# Patient Record
Sex: Male | Born: 1970 | Race: White | Hispanic: No | Marital: Married | Smoking: Never smoker
Health system: Southern US, Academic
[De-identification: ages and names within clinical notes are randomized; demographics above are authoritative.]

## PROBLEM LIST (undated history)

## (undated) DIAGNOSIS — K922 Gastrointestinal hemorrhage, unspecified: Secondary | ICD-10-CM

## (undated) DIAGNOSIS — M109 Gout, unspecified: Secondary | ICD-10-CM

## (undated) DIAGNOSIS — I10 Essential (primary) hypertension: Secondary | ICD-10-CM

## (undated) DIAGNOSIS — I639 Cerebral infarction, unspecified: Secondary | ICD-10-CM

## (undated) HISTORY — PX: ESOPHAGOGASTRODUODENOSCOPY: SHX1529

## (undated) HISTORY — PX: HX ANGIOPLASTY: SHX39

---

## 2007-12-19 DIAGNOSIS — D500 Iron deficiency anemia secondary to blood loss (chronic): Secondary | ICD-10-CM | POA: Insufficient documentation

## 2007-12-19 DIAGNOSIS — K922 Gastrointestinal hemorrhage, unspecified: Secondary | ICD-10-CM | POA: Insufficient documentation

## 2014-11-30 HISTORY — PX: COLONOSCOPY: WVUENDOPRO10

## 2016-12-25 ENCOUNTER — Emergency Department (HOSPITAL_COMMUNITY): Payer: Self-pay | Admitting: Emergency Medicine

## 2018-09-30 ENCOUNTER — Emergency Department
Admission: EM | Admit: 2018-09-30 | Discharge: 2018-09-30 | Disposition: A | Discharge status: Home / Self Care | Payer: 59 | Attending: Emergency Medicine | Admitting: Emergency Medicine

## 2018-09-30 ENCOUNTER — Encounter (HOSPITAL_COMMUNITY): Payer: Self-pay

## 2018-09-30 ENCOUNTER — Emergency Department (HOSPITAL_COMMUNITY): Payer: 59

## 2018-09-30 ENCOUNTER — Other Ambulatory Visit: Payer: Self-pay

## 2018-09-30 DIAGNOSIS — I10 Essential (primary) hypertension: Secondary | ICD-10-CM

## 2018-09-30 DIAGNOSIS — M79605 Pain in left leg: Secondary | ICD-10-CM | POA: Insufficient documentation

## 2018-09-30 HISTORY — DX: Gout, unspecified: M10.9

## 2018-09-30 HISTORY — DX: Gastrointestinal hemorrhage, unspecified: K92.2

## 2018-09-30 HISTORY — DX: Essential (primary) hypertension: I10

## 2018-09-30 MED ORDER — KETOROLAC 60 MG/2 ML INTRAMUSCULAR SOLUTION
60.0000 mg | INTRAMUSCULAR | Status: AC
Start: 2018-09-30 — End: 2018-09-30
  Administered 2018-09-30: 60 mg via INTRAMUSCULAR
  Filled 2018-09-30 (×2): qty 2

## 2018-09-30 MED ORDER — IBUPROFEN 800 MG TABLET
800.00 mg | ORAL_TABLET | Freq: Three times a day (TID) | ORAL | 0 refills | Status: DC | PRN
Start: 2018-09-30 — End: 2019-12-12

## 2018-09-30 NOTE — ED Provider Notes (Signed)
[facility]  Emergency Department  Provider Note    Name: Troy Rose  Age and Gender: 47 y.o. male  Date of Birth: [birth year]  Date of Service: 09/30/2018  MRN: [id number]  PCP: No Pcp    Chief Complaint:   Chief Complaint   Patient presents with   . Leg Pain     LLE pain, denies injury       HPI:  Leg Pain   Associated symptoms: no back pain, no fever and no neck pain          This is a 47 y.o. male who presents to the emergency department with complaints of left leg pain.  He had had back pain for about a week ago.  That has slowly improved but there has been improved pain extending down his left leg and all the way down to his left foot since that time.  Those symptoms have all more less resolved with the exception of 1 area in his left calf that is still of concern to him.  It is causing him enough pain that he was unable to sleep last night.  Seems to get worse when he lies flat or sit down.  Better with standing.  Has not taken any medications other than some muscle relaxant medication that he got from his primary care provider yesterday.        Past Medical History:   Past Medical History:   Diagnosis Date   . Acute upper GI bleed    . Gout    . HTN (hypertension)        Past Surgical History:   Past Surgical History:   Procedure Laterality Date   . Esophagogastroduodenoscopy         Social History:   Social History     Tobacco Use   . Smoking status: Never Smoker   . Smokeless tobacco: Never Used   Substance Use Topics   . Alcohol use: Never     Frequency: Never   . Drug use: Never      Social History     Substance and Sexual Activity   Drug Use Never       Allergies:   Allergies   Allergen Reactions   . Tamiflu [Oseltamivir]  Other Adverse Reaction (Add comment)     Jittery         Review of Systems   Constitutional: Negative for chills and fever.   HENT: Negative for congestion, sinus pain and sore throat.    Eyes: Negative for blurred vision and double vision.   Respiratory: Negative  for cough and shortness of breath.    Cardiovascular: Negative for chest pain, palpitations and leg swelling.   Gastrointestinal: Negative for abdominal pain, constipation, diarrhea, heartburn, melena, nausea and vomiting.   Genitourinary: Negative for dysuria, frequency and urgency.   Musculoskeletal: Positive for myalgias. Negative for back pain and neck pain.        Leg pain / calf pain   Skin: Negative for rash.   Neurological: Negative for dizziness, weakness and headaches.   Endo/Heme/Allergies: Negative for polydipsia.   Psychiatric/Behavioral: Negative for depression and suicidal ideas.         BP (!) 182/118   Pulse 78   Temp 36.8 C (98.3 F)   Resp 18   Ht 1.829 m (6')   Wt 115.7 kg (255 lb)   SpO2 98%   BMI 34.58 kg/m         Physical Exam  Constitutional: He is oriented to person, place, and time and well-developed, well-nourished, and in no distress.   HENT:   Head: Normocephalic and atraumatic.   Eyes: Pupils are equal, round, and reactive to light.   Neck: Normal range of motion. Neck supple.   Cardiovascular: Normal rate, regular rhythm, normal heart sounds and intact distal pulses.   Pulmonary/Chest: Effort normal and breath sounds normal. No respiratory distress.   Abdominal: Soft. Bowel sounds are normal. He exhibits no distension.   Musculoskeletal: Normal range of motion. He exhibits no edema.        Left lower leg: He exhibits tenderness. He exhibits no bony tenderness, no swelling, no edema, no deformity and no laceration.        Legs:  Lymphadenopathy:     He has no cervical adenopathy.   Neurological: He is alert and oriented to person, place, and time. Gait normal. GCS score is 15.   Skin: Skin is warm and dry.   Psychiatric: Affect and judgment normal.         Procedures  :    No results found for this or any previous visit (from the past 12 hour(s)).    No orders to display         Clinical Impression:   Encounter Diagnosis   Name Primary?   . Pain in left leg        ED Course /  MDM:   Patient was triaged, vital signs were obtained, patient was  placed in a room.  On exam there is pain on the in the left calf area.  Workup includes a ultrasound does not show any abnormalities as far as DVT.  It appears more focal muscular reproducible pain.  Consideration for referred pain and neuropathic leave from the back although less likely because of his reproducibility.  Will treat with ibuprofen.    ED Course as of Sep 30 1450   Fri Sep 30, 2018   1340 Pt to u/s    [JD]   1340 Rechecking manual blood pressure    [JD]   1447 Pt feeling better overall.  Awaiting u/s results.     [JD]      ED Course User Index  [JD] Alvina Filbert, MD       Medications   ketorolac (TORADOL) 60mg /2 mL IM injection (60 mg Intramuscular Given 09/30/18 1313)       Disposition: Data Unavailable      Alvina Filbert, MD 09/30/2018 14:51       This note was partially created using voice recognition software and is inherently subject to errors including those of syntax and "sound alike " substitutions which may escape proof reading.  In such instances, original meaning may be extrapolated by contextual derivation.

## 2018-09-30 NOTE — Nurses Notes (Signed)
PT D/C TO HOME, D/C INSTRUCTIONS GIVEN AND REVIEWED WITH PT AND PT VERBALIZED UNDERSTANDING, VITALS RECHECKED AND PT REFUSED MANUAL BP, STATED HE IS JUST READY TO LEAVE NOW AND DOESN'T WANT TO WAIT FOR MANUAL BP THAT HE WILL FOLLOW UP WITH PRIMARY DOCTOR REGARDING HIS BP AND LEG PAIN. PT LEFT AMBULATORY WITHOUT INCIDENT REFUSES WHEELCHAIR, PT STATED HE IS DRIVING HIMSELF HOME

## 2018-09-30 NOTE — Incoming ED Transfer Note (Signed)
ED TRANSFER NOTE    Narrative: PT DISCHARGED    Type of Transfer: HOME      **RU[facility] ONLY - Go To Navigator and choose the Type of Activation to print to [facility]**    Reeves Forth, RN, 09/30/2018 13:58

## 2018-09-30 NOTE — ED Triage Notes (Signed)
LLE pain and lower back pain since Sep 10, 2018. Left knee pain. Denies injury or swelling. Evaluated at [facility] yesterday. Had x-ray of back and rx'd a muscle relaxer -- no relief.

## 2018-09-30 NOTE — Discharge Instructions (Addendum)
Ice compression and ibuprofen should help.    Check your blood pressures at home and follow up  with your pcp in the next week to evaluate the need for additional bp meds.

## 2019-12-12 ENCOUNTER — Emergency Department
Admission: EM | Admit: 2019-12-12 | Discharge: 2019-12-12 | Disposition: A | Discharge status: Home / Self Care | Payer: 59 | Attending: PHYSICIAN ASSISTANT | Admitting: PHYSICIAN ASSISTANT

## 2019-12-12 ENCOUNTER — Other Ambulatory Visit: Payer: Self-pay

## 2019-12-12 ENCOUNTER — Emergency Department (HOSPITAL_COMMUNITY): Payer: 59

## 2019-12-12 ENCOUNTER — Encounter (HOSPITAL_COMMUNITY): Payer: Self-pay

## 2019-12-12 DIAGNOSIS — M109 Gout, unspecified: Secondary | ICD-10-CM | POA: Insufficient documentation

## 2019-12-12 LAB — CBC WITH DIFF
BASOPHIL #: <0.1 10*3/uL (ref <=0.20)
BASOPHIL %: 0 %
EOSINOPHIL #: <0.1 10*3/uL (ref <=0.50)
EOSINOPHIL %: 0 %
HCT: 46.3 % (ref 38.9–52.0)
HGB: 14.6 g/dL (ref 13.4–17.5)
IMMATURE GRANULOCYTE #: 0.1 10*3/uL — ABNORMAL HIGH (ref <0.10)
IMMATURE GRANULOCYTE %: 1 % (ref 0–1)
LYMPHOCYTE #: 2.03 10*3/uL (ref 1.00–4.80)
LYMPHOCYTE %: 13 %
MCH: 27.1 pg (ref 26.0–32.0)
MCHC: 31.5 g/dL (ref 31.0–35.5)
MCV: 85.9 fL (ref 78.0–100.0)
MONOCYTE #: 1.34 10*3/uL — ABNORMAL HIGH (ref 0.20–1.10)
MONOCYTE %: 9 %
MPV: 8.6 fL — ABNORMAL LOW (ref 8.7–12.5)
NEUTROPHIL #: 12.04 10*3/uL — ABNORMAL HIGH (ref 1.50–7.70)
NEUTROPHIL %: 77 %
PLATELETS: 519 10*3/uL — ABNORMAL HIGH (ref 150–400)
RBC: 5.39 10*6/uL (ref 4.50–6.10)
RDW-CV: 14.1 % (ref 11.5–15.5)
WBC: 15.6 10*3/uL — ABNORMAL HIGH (ref 3.7–11.0)

## 2019-12-12 LAB — COMPREHENSIVE METABOLIC PANEL, NON-FASTING
ALBUMIN: 4.9 g/dL (ref 3.5–5.0)
ALKALINE PHOSPHATASE: 85 U/L (ref 38–126)
ALT (SGPT): 12 U/L (ref 0–49)
ANION GAP: 16 mmol/L (ref 8–16)
AST (SGOT): 16 U/L — ABNORMAL LOW (ref 17–59)
BILIRUBIN TOTAL: 0.5 mg/dL (ref 0.2–1.3)
BUN/CREA RATIO: 16
BUN: 26 mg/dL — ABNORMAL HIGH (ref 9–20)
CALCIUM: 10.9 mg/dL — ABNORMAL HIGH (ref 8.4–10.2)
CHLORIDE: 102 mmol/L (ref 98–107)
CO2 TOTAL: 21 mmol/L — ABNORMAL LOW (ref 22–30)
CREATININE: 1.6 mg/dL — ABNORMAL HIGH (ref 0.66–1.25)
ESTIMATED GFR: 49 mL/min/{1.73_m2} — ABNORMAL LOW (ref >60)
GLUCOSE: 110 mg/dL — ABNORMAL HIGH (ref 74–106)
POTASSIUM: 4.8 mmol/L (ref 3.5–5.1)
PROTEIN TOTAL: 8.7 g/dL — ABNORMAL HIGH (ref 6.3–8.2)
SODIUM: 139 mmol/L (ref 137–145)

## 2019-12-12 LAB — RED TOP TUBE

## 2019-12-12 LAB — GRAY TOP TUBE

## 2019-12-12 LAB — URIC ACID: URIC ACID: 10.9 mg/dL — ABNORMAL HIGH (ref 3.5–8.5)

## 2019-12-12 LAB — BLUE TOP TUBE

## 2019-12-12 LAB — C-REACTIVE PROTEIN(CRP),INFLAMMATION: C-REACTIVE PROTEIN (CRP): 6.2 mg/dL — ABNORMAL HIGH (ref 0.0–0.9)

## 2019-12-12 LAB — LIGHT GREEN TOP TUBE

## 2019-12-12 MED ORDER — METHYLPREDNISOLONE 4 MG TABLETS IN A DOSE PACK
ORAL_TABLET | ORAL | 0 refills | Status: DC
Start: 2019-12-12 — End: 2022-04-09

## 2019-12-12 MED ORDER — ACETAMINOPHEN 300 MG-CODEINE 30 MG TABLET
1.00 | ORAL_TABLET | Freq: Four times a day (QID) | ORAL | 0 refills | Status: DC | PRN
Start: 2019-12-12 — End: 2022-04-09

## 2019-12-12 MED ORDER — COLCHICINE 0.6 MG TABLET
1.20 mg | ORAL_TABLET | ORAL | Status: AC
Start: 2019-12-12 — End: 2019-12-12
  Administered 2019-12-12: 1.2 mg via ORAL
  Filled 2019-12-12: qty 2

## 2019-12-12 MED ORDER — HYDROCODONE 5 MG-ACETAMINOPHEN 325 MG TABLET
1.0000 | ORAL_TABLET | ORAL | Status: AC
Start: 2019-12-12 — End: 2019-12-12
  Administered 2019-12-12: 14:00:00 1 via ORAL
  Filled 2019-12-12: qty 1

## 2019-12-12 MED ORDER — COLCHICINE 0.6 MG TABLET
0.60 mg | ORAL_TABLET | ORAL | Status: DC
Start: 2019-12-12 — End: 2019-12-12
  Administered 2019-12-12: 0.6 mg via ORAL
  Filled 2019-12-12: qty 1

## 2019-12-12 NOTE — Discharge Instructions (Addendum)
General Instructions:  Today you were seen in the Emergency Department for knee pain and gout. You are considered stable for discharge from the emergency department. Please carefully follow all the instructions you were given verbally as well as the written instructions given below. Immediately return to the emergency department if the symptoms you presented with today increase in severity, change in any way, and/or do not improve in what you consider an acceptable time frame.  Return if you develop fever >101, vomiting, oral liquid intolerance, chest pain, shortness of breath, weakness, change in behavior, or any other concerns.    Medication(s) Instructions:  Discontinue meloxicam due to your kidney function.  Please follow-up with your family doctor on this.  Prescriptions were written for Medrol Dosepak and Tylenol No.  3. Indications, contraindications, adverse effects, potential side effects, and medication instructions were discussed with the patient/patient's family prior to discharge. Please strictly follow the directions as prescribed for taking the medication(s). Should you feel you develop any type of reaction to the medication(s), including, but not limited to, rash, swelling of the lips or face, or difficulty breathing, immediately discontinue the use of the medication(s) and seek prompt medical care. Please read the medication(s) insert provided by the pharmacy and follow all guidelines and recommendations. Please take all medications as prescribed for your symptoms or diagnoses.                Follow-Up Instructions:  Follow-up with your family doctor. It is your responsibility to call and/or make an appointment with the health care providers listed on your discharge papers and/or your primary care provider in the appropriate time frame given.  Please take a copy of your discharge papers with you to your follow-up appointment(s). Please follow up with your primary care physician in 2-3 days or  earlier if needed for your symptoms. Please follow up with any specialist provider in clinic that your were given instructions to see as an outpatient.     YOU MUST CALL THE NUMBER LISTED ON THE DISCHARGE PAPERWORK TO CONFIRM YOUR APPOINTMENT.    Special Information / Instructions:  Please read and follow all attached discharge instructions.      Return Precautions:  Patient/patient's family was advised to return to the ED with any new, concerning or worsening symptoms . The patient/patient's family verbalized understanding of all instructions, were given the opportunity to ask questions, and had no further questions or concerns.     Call the [facility] Emergency Department at [phone] with any questions or concerns.    Return to the Emergency Department if you have persistence or worsening of your symptoms.    Thank you for allowing me to take part in your care.    [employer], PA-C  12/12/2019, 15:11

## 2019-12-12 NOTE — ED Nurses Note (Signed)
Thayer PA-C in to speak to patient

## 2019-12-12 NOTE — ED Triage Notes (Signed)
Left knee pain and swelling denies injury. Hx recent gout flare

## 2019-12-12 NOTE — ED Nurses Note (Signed)
Patient discharged home with family.  AVS reviewed with patient/care giver.  A written copy of the AVS and discharge instructions was given to the patient/care giver.  Questions sufficiently answered as needed.  Patient/care giver encouraged to follow up with PCP as indicated.  In the event of an emergency, patient/care giver instructed to call 911 or go to the nearest emergency room.

## 2019-12-12 NOTE — ED Provider Notes (Signed)
[facility]  Emergency Department  Provider Note      Troy Rose  1971/05/26  48 y.o.  male  [place] [address]   [phone] (home)  [facility]    Chief Complaint:   Chief Complaint   Patient presents with   . Knee Pain     left       HPI: This is a 49 y.o. male who presents to the emergency department with complaints of left knee pain x6 days.  Patient top this was initially his gout flaring up.  He saw a provider at his PCPs office on Friday and was administered Toradol her Solu-Medrol without any benefit.  He has also been taking meloxicam which typically works for him.  He rates his pain at 10/10.  He notes swelling and warmth.  Bearing weight and movement makes the pain worse.  There are no alleviating factors.  He denies injury or fever.    COVID screening:  Patient denies shortness of breath, cough, and lost of taste or smell.  Patient has had no recent travel or known COVID exposures.    Past Medical History:   Past Medical History:   Diagnosis Date   . Acute upper GI bleed    . Gout    . HTN (hypertension)        Past Surgical History:   Past Surgical History:   Procedure Laterality Date   . Esophagogastroduodenoscopy         Social History:   Social History     Tobacco Use   . Smoking status: Never Smoker   . Smokeless tobacco: Never Used   Substance Use Topics   . Alcohol use: Never     Frequency: Never   . Drug use: Never      Social History     Substance and Sexual Activity   Drug Use Never       Allergies:   Allergies   Allergen Reactions   . Motrin [Ibuprofen] GI Bleed   . Tamiflu [Oseltamivir]  Other Adverse Reaction (Add comment)     Jittery       Medications:   Prior to Admission medications    Medication Sig Start Date End Date Taking? Authorizing Provider   lisinopril (PRINIVIL) 10 mg Oral Tablet Take 10 mg by mouth Once a day    Provider, Historical   meloxicam (MOBIC) 7.5 mg Oral Tablet Take 7.5 mg by mouth Once a day   Yes Provider, Historical      metoprolol tartrate (LOPRESSOR) 25 mg Oral Tablet Take 25 mg by mouth Twice daily    Provider, Historical   Ibuprofen (MOTRIN) 800 mg Oral Tablet Take 1 Tab (800 mg total) by mouth Three times a day as needed for Pain (as needed for pain) 09/30/18 12/12/19  Noni Saupe, MD   methocarbamol (ROBAXIN) 500 mg Oral Tablet Take 500 mg by mouth Four times a day  12/12/19  Provider, Historical       Above history, allergies and medication list reviewed with patient.      Review of Systems:  Consitutional:  Negative for fever.  Negative for chills.  Skin:  Negative for rashes or other lesions.  HENT:  Negative for headaches.  Eyes:  Negative for blurry vision or double vision.  No acute vision loss.  Cardiovascular:  Negative for chest pain and palpitations.  Respiratory:  Negative for cough, shortness of breath and wheezing.  Gastrointestinal:  Negative for nausea,  vomiting and abdominal pain.  No diarrhea, constipation, melena or hematochezia.  Musculoskeletal:  Left knee pain Negative for neck pain and back pain.  Neurological:  Negative for dizziness and loss of consciousness.  No numbness, tingling or other sensation changes.  No acute localized weakness.  Psych:  Negative for anxiety or depression.  No suicidal ideations or attempts.  No homicidal ideations or attempts.  Genitourinary:  Negative for dysuria, hematuria and flank pain.  All other systems reviewed and negative.      Physical Exam  Body mass index is 33.36 kg/m.  ED Triage Vitals [12/12/19 1324]   BP (Non-Invasive) (!) 156/105   Heart Rate (!) 120   Respiratory Rate 16   Temperature 37.1 C (98.8 F)   SpO2 98 %   Weight 112 kg (246 lb)   Height 1.829 m (6')     Constitutional: patient is oriented to person, place, and time and well-developed, well-nourished, and in no distress.   HENT:   Mask in place.  Head: Normocephalic and atraumatic.   Right Ear: External ear normal.   Left Ear: External ear normal.   Nose: Nose normal.   Mouth/Throat:  Oropharynx is clear and moist.   Eyes: Pupils are equal, round, and reactive to light. Conjunctivae and EOM are normal.   Neck: Normal range of motion. Neck supple.  Trachea midline.  Cardiovascular: Normal rate, regular rhythm, normal heart sounds and intact distal pulses.   Pulmonary/Chest: Effort normal and breath sounds normal.  No chest wall deformity.  Abdominal: Soft. Bowel sounds are normal.  No rebound tenderness or guarding.  Musculoskeletal:  Left knee is generally tender to palpation.  It is swollen and warm to the touch.  There is no erythema.  He does have full range of motion but it is painful.  Distal pulses are strong and equal.  Neurological: Patient is alert and oriented to person, place, and time. GCS score is 15. Gait is at patient's baseline.  Skin: Skin is warm and dry.   Psychiatric: Mood, memory, affect and judgment normal.        ED Course/ MDM/ Plan:   Patient was triaged, vital signs were obtained, patient was  placed in a room.  On exam, patient is alert and oriented, nontoxic on exam, and in no acute distress. Vitals were reviewed. The following orders were placed after examining the patient and the following results were reviewed by me.    Orders Placed This Encounter   . * XR KNEE LEFT 3 V   . CBC/DIFF   . COMPREHENSIVE METABOLIC PANEL, NON-FASTING   . URIC ACID   . C-REACTIVE PROTEIN(CRP),INFLAMMATION   . CBC WITH DIFF   . EXTRA TUBES   . BLUE TOP TUBE   . RED TOP TUBE   . LIGHT GREEN TOP TUBE   . GRAY TOP TUBE   . HYDROcodone-acetaminophen (NORCO) 5-325 mg per tablet   . colchicine tablet   . colchicine tablet   . Methylprednisolone (MEDROL DOSEPACK) 4 mg Oral Tablets, Dose Pack   . acetaminophen-codeine (TYLENOL #3) 300-30 mg Oral Tablet     * XR KNEE LEFT 3 V    (Results Pending)     Labs Reviewed   COMPREHENSIVE METABOLIC PANEL, NON-FASTING - Abnormal; Notable for the following components:       Result Value    CO2 TOTAL 21 (*)     BUN 26 (*)     CREATININE 1.60 (*)     ESTIMATED  GFR 49 (*)     CALCIUM 10.9 (*)     GLUCOSE 110 (*)     AST (SGOT) 16 (*)     PROTEIN TOTAL 8.7 (*)     All other components within normal limits    Narrative:     Estimated Glomerular Filtration Rate (eGFR) calculated using the CKD-EPI (2009) equation, intended for patients 24 years of age and older. If race and/or gender is not documented or "unknown," there will be no eGFR calculation.   URIC ACID - Abnormal; Notable for the following components:    URIC ACID 10.9 (*)     All other components within normal limits   C-REACTIVE PROTEIN(CRP),INFLAMMATION - Abnormal; Notable for the following components:    C-REACTIVE PROTEIN (CRP) 6.2 (*)     All other components within normal limits   CBC WITH DIFF - Abnormal; Notable for the following components:    WBC 15.6 (*)     PLATELETS 519 (*)     MPV 8.6 (*)     NEUTROPHIL # 12.04 (*)     MONOCYTE # 1.34 (*)     IMMATURE GRANULOCYTE # 0.10 (*)     All other components within normal limits   CBC/DIFF    Narrative:     The following orders were created for panel order CBC/DIFF.  Procedure                               Abnormality         Status                     ---------                               -----------         ------                     CBC WITH DIFF[[id number]]                Abnormal            Final result                 Please view results for these tests on the individual orders.   EXTRA TUBES    Narrative:     The following orders were created for panel order EXTRA TUBES.  Procedure                               Abnormality         Status                     ---------                               -----------         ------                     BLUE TOP QPRF[[id number]]                                    In process  RED TOP XTAV[[id number]]                                     In process                 LIGHT GREEN TOP PVVZ[[id number]]                             In process                 GRAY TOP JQGB[[id number]]                                     In process                   Please view results for these tests on the individual orders.   BLUE TOP TUBE   RED TOP TUBE   LIGHT GREEN TOP TUBE   GRAY TOP TUBE     Results for orders placed or performed during the hospital encounter of 12/12/19 (from the past 12 hour(s))   COMPREHENSIVE METABOLIC PANEL, NON-FASTING   Result Value Ref Range    SODIUM 139 137 - 145 mmol/L    POTASSIUM 4.8 3.5 - 5.1 mmol/L    CHLORIDE 102 98 - 107 mmol/L    CO2 TOTAL 21 (L) 22 - 30 mmol/L    ANION GAP 16 8 - 16 mmol/L    BUN 26 (H) 9 - 20 mg/dL    CREATININE 1.60 (H) 0.66 - 1.25 mg/dL    BUN/CREA RATIO 16     ESTIMATED GFR 49 (L) >60 mL/min/1.92m2    ALBUMIN 4.9 3.5 - 5.0 g/dL    CALCIUM 10.9 (H) 8.4 - 10.2 mg/dL    GLUCOSE 110 (H) 74 - 106 mg/dL    ALKALINE PHOSPHATASE 85 38 - 126 U/L    ALT (SGPT) 12 0 - 49 U/L    AST (SGOT) 16 (L) 17 - 59 U/L    BILIRUBIN TOTAL 0.5 0.2 - 1.3 mg/dL    PROTEIN TOTAL 8.7 (H) 6.3 - 8.2 g/dL   URIC ACID   Result Value Ref Range    URIC ACID 10.9 (H) 3.5 - 8.5 mg/dL   C-REACTIVE PROTEIN(CRP),INFLAMMATION   Result Value Ref Range    C-REACTIVE PROTEIN (CRP) 6.2 (H) 0.0 - 0.9 mg/dL   CBC WITH DIFF   Result Value Ref Range    WBC 15.6 (H) 3.7 - 11.0 x10^3/uL    RBC 5.39 4.50 - 6.10 x10^6/uL    HGB 14.6 13.4 - 17.5 g/dL    HCT 46.3 38.9 - 52.0 %    MCV 85.9 78.0 - 100.0 fL    MCH 27.1 26.0 - 32.0 pg    MCHC 31.5 31.0 - 35.5 g/dL    RDW-CV 14.1 11.5 - 15.5 %    PLATELETS 519 (H) 150 - 400 x10^3/uL    MPV 8.6 (L) 8.7 - 12.5 fL    NEUTROPHIL % 77 %    LYMPHOCYTE % 13 %    MONOCYTE % 9 %    EOSINOPHIL % 0 %    BASOPHIL % 0 %    NEUTROPHIL # 12.04 (H) 1.50 - 7.70 x10^3/uL    LYMPHOCYTE # 2.03 1.00 - 4.80 x10^3/uL  MONOCYTE # 1.34 (H) 0.20 - 1.10 x10^3/uL    EOSINOPHIL # <0.10 <=0.50 x10^3/uL    BASOPHIL # <0.10 <=0.20 x10^3/uL    IMMATURE GRANULOCYTE % 1 0 - 1 %    IMMATURE GRANULOCYTE # 0.10 (H) <0.10 x10^3/uL     No orders to display         The patient received the following medications while in the ED  (also noted above):  Medications   colchicine tablet (has no administration in time range)   colchicine tablet (has no administration in time range)   HYDROcodone-acetaminophen (NORCO) 5-325 mg per tablet (1 Tab Oral Given 12/12/19 1355)       ED Course as of Dec 11 1514   Tue Dec 12, 2019   1433 WBC elevated at 15.6 and platelets 519.   CBC/DIFF(!) [CT]   1503 Elevated 10.9   URIC ACID(!): 10.9 [CT]   1503 Elevated 6.2   C-REACTIVE PROTEIN (CRP)(!): 6.2 [CT]   1503 BUN elevated at 26.  Creatinine elevated 1.6.  Calcium elevated 10.9.  AST is low 16. Total protein is elevated 8.7.   COMPREHENSIVE METABOLIC PANEL, NON-FASTING(!) [CT]      ED Course User Index  [CT] Thayer-Hughes, Jilda Kress, PA-C   Patient's uric acid was significantly elevated.  There were no other processes found such as infectious or bony injury.  Due to the patient's renal function, of recommended the patient discontinue meloxicam.  I recommended him to follow up with his PCP.  He was given colchicine and steroids.  He remained hemodynamically stable during his ED course.  Board of Pharmacy was reviewed.  There are no signs of diversion or abuse noted.  He was discharged home.    Procedures  None    Critical Care:    Not applicable      Clinical Impression:   Encounter Diagnosis   Name Primary?   . Acute gout of left knee, unspecified cause Yes           Disposition: Discharged  New Prescriptions    ACETAMINOPHEN-CODEINE (TYLENOL #3) 300-30 MG ORAL TABLET    Take 1 Tab by mouth Every 6 hours as needed    METHYLPREDNISOLONE (MEDROL DOSEPACK) 4 MG ORAL TABLETS, DOSE PACK    Take as instructed.      [facility] [address]  [place] [address]  [phone]    In 2 days  Have your kidney function rechecked.     BP (!) 156/105   Pulse (!) 120   Temp 37.1 C (98.8 F)   Resp 16   Ht 1.829 m (6')   Wt 112 kg (246 lb)   SpO2 98%   BMI 33.36 kg/m          Brandan Glauber Thayer-Hughes, PA-C       This note was partially created using  voice recognition software and is inherently subject to errors including those of syntax and "sound alike " substitutions which may escape proof reading.  In such instances, original meaning may be extrapolated by contextual derivation.

## 2019-12-12 NOTE — ED Nurses Note (Signed)
Lab at bedside for blood draw.

## 2021-03-13 IMAGING — MR MRI LUMBAR SPINE WITHOUT CONTRAST
6 series · 42 of 48 positions shown · IV contrast (gadolinium)
Comparison: None available.

﻿EXAM:  80788   MRI LUMBAR SPINE WITHOUT CONTRAST
INDICATION: Lower back pain with right lower extremity radiculopathy for several months.
TECHNIQUE: Multiplanar multisequential MRI of the lumbar spine was performed without gadolinium contrast.

[Series 8: T2 · sagittal · 4.0mm · 1.01mm/px · 5 of 13 slices shown (1 of 3)]
[im 1/13]
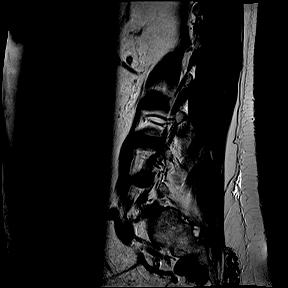
[im 4/13]
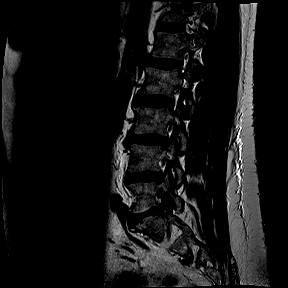
[im 7/13]
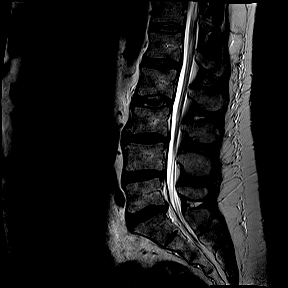
[im 10/13]
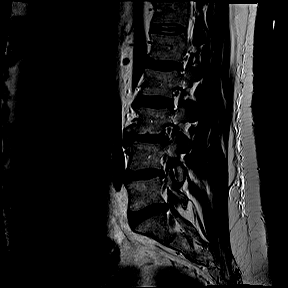
[im 13/13]
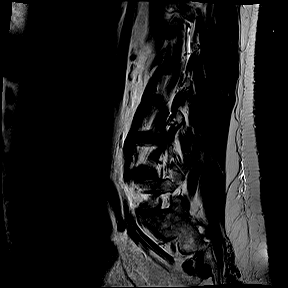

[Series 9: T1 · sagittal · 4.0mm · 1.01mm/px · 6 of 13 slices shown (1 of 2)]
[im 1/13]
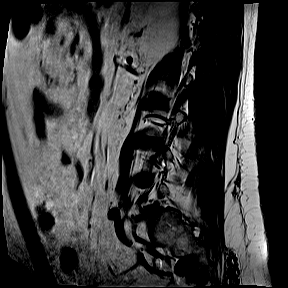
[im 3/13]
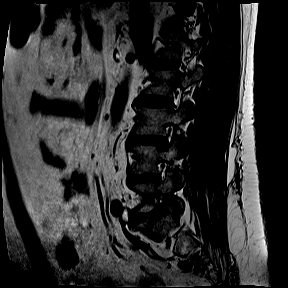
[im 5/13]
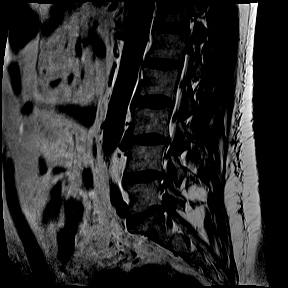
[im 8/13]
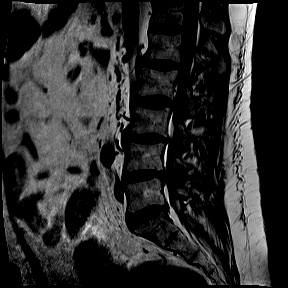
[im 10/13]
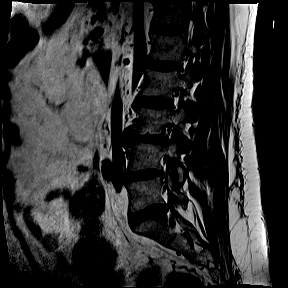
[im 13/13]
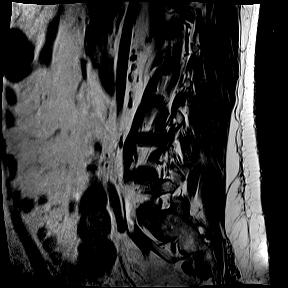

[Series 11: STIR · sagittal · 4.0mm · 1.13mm/px · 3 of 13 slices shown]
[im 1/13]
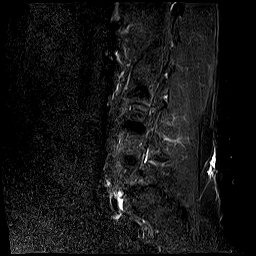
[im 3/13]
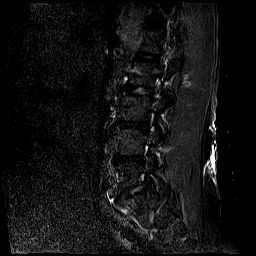
[im 5/13]
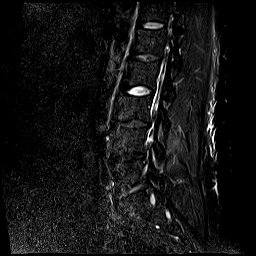

[Series 12: T2 · axial · 4.0mm · 0.47mm/px · z∈[-93,+97]mm · 11 of 23 slices shown (2 of 3)]
[im 1/23]
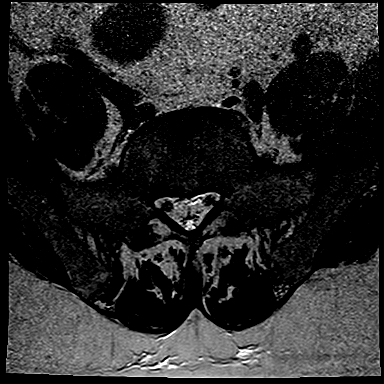
[im 3/23]
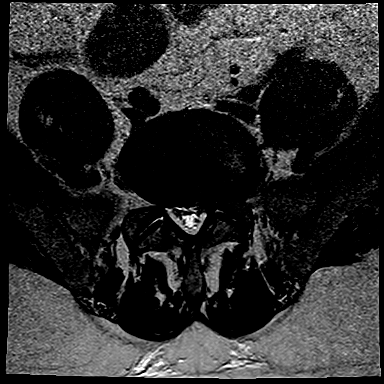
[im 5/23]
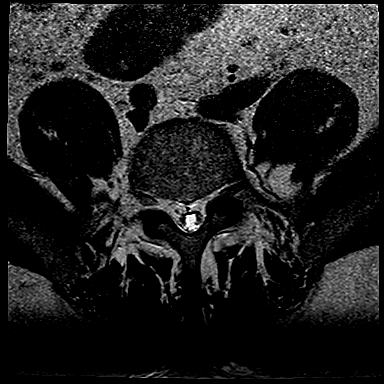
[im 7/23]
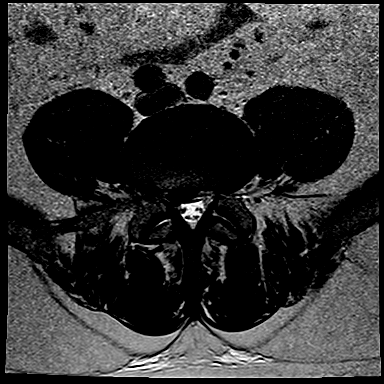
[im 9/23]
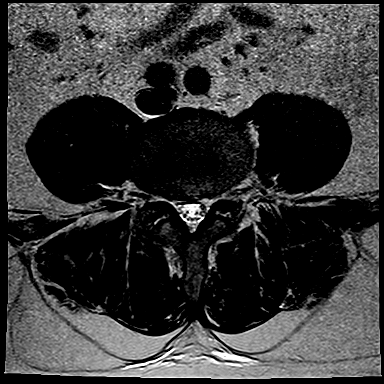
[im 12/23]
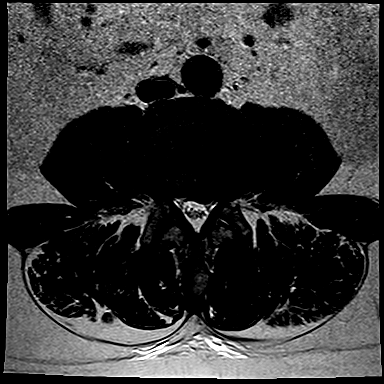
[im 14/23]
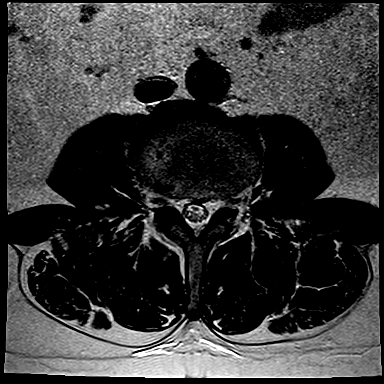
[im 16/23]
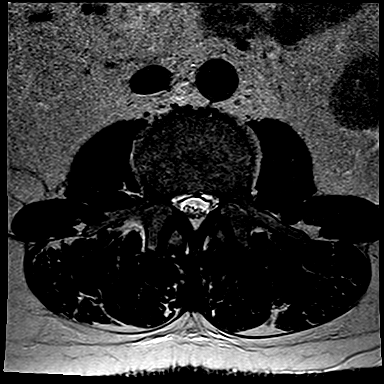
[im 18/23]
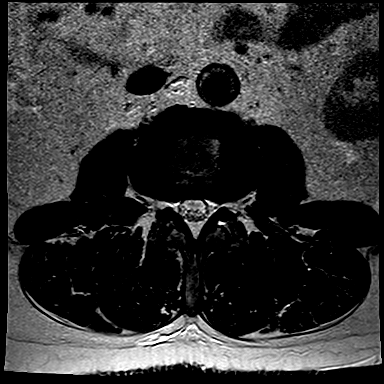
[im 20/23]
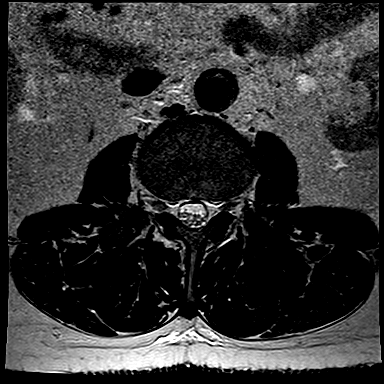
[im 23/23]
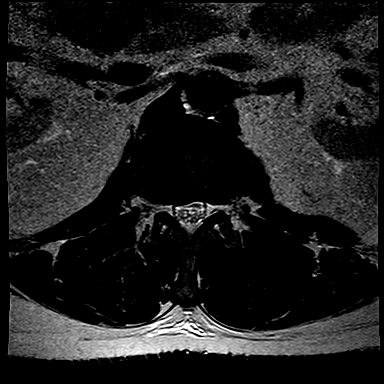

[Series 13: T1 · axial · 4.0mm · 0.47mm/px · z∈[-93,+97]mm · 8 of 23 slices shown (2 of 2)]
[im 1/23]
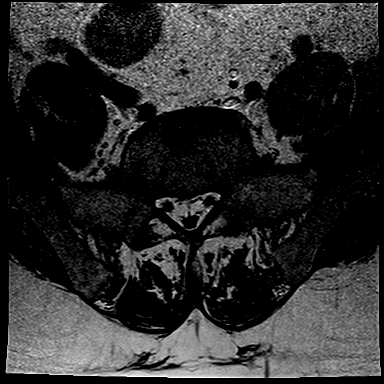
[im 5/23]
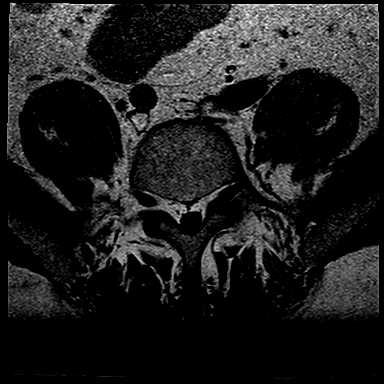
[im 7/23]
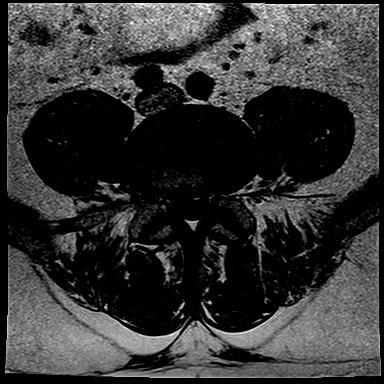
[im 9/23]
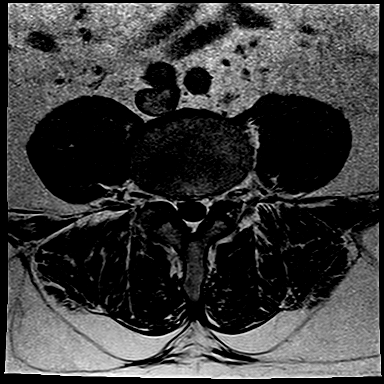
[im 14/23]
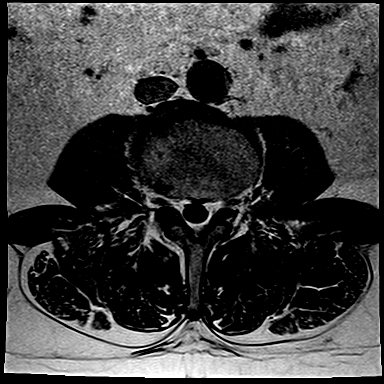
[im 16/23]
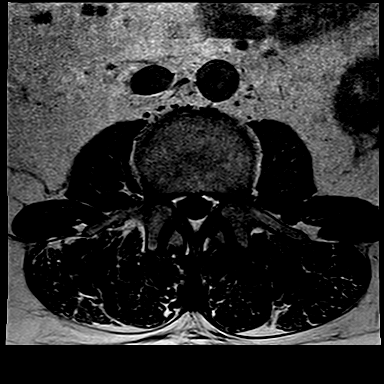
[im 18/23]
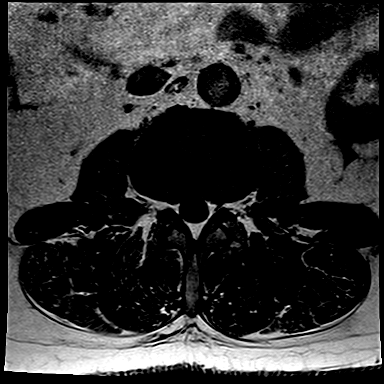
[im 23/23]
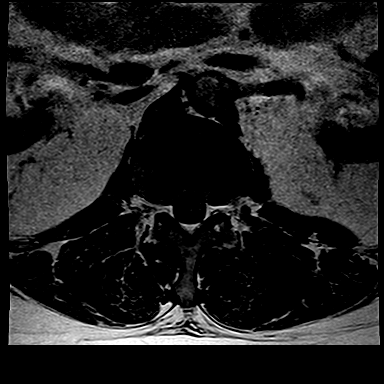

[Series 14: T2 · coronal · 4.0mm · 1.15mm/px · 9 of 20 slices shown (3 of 3)]
[im 1/20]
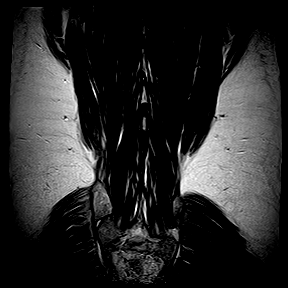
[im 3/20]
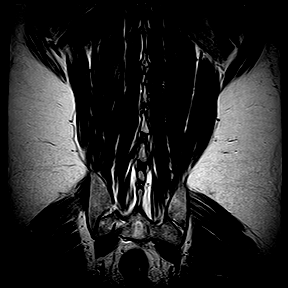
[im 5/20]
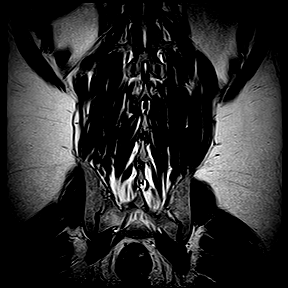
[im 8/20]
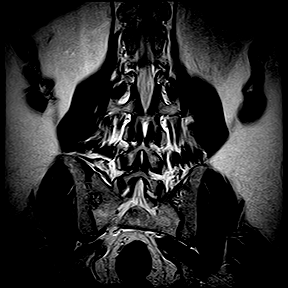
[im 10/20]
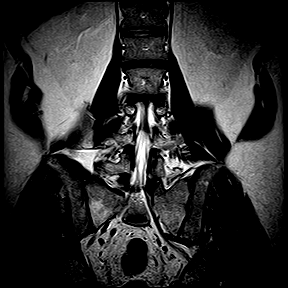
[im 12/20]
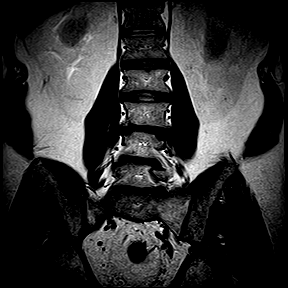
[im 15/20]
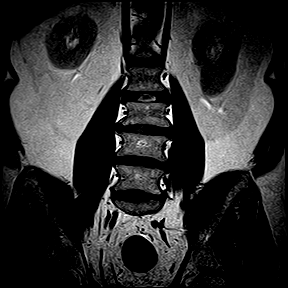
[im 17/20]
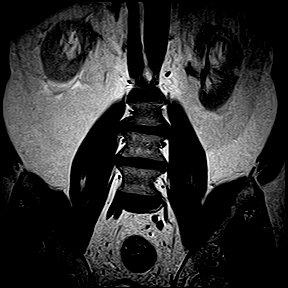
[im 20/20]
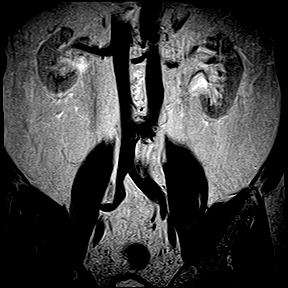

[42 of 48 positions shown; findings below may reference images not displayed]

FINDINGS: Vertebral bodies are normal in height, alignment and signal intensity. There is no acute fracture or subluxation. Distal spinal cord is normal in signal intensity and terminates normally at L1 vertebral body level. Spinal canal is congenitally narrow.

At L1-2 level, there is a small broad-based central and right paracentral disc bulge, mildly effacing the ventral thecal sac. There is mild bilateral neural foraminal stenosis from bulging annulus without nerve root impingement.

L2-3 level is unremarkable.

At L3-4 level, there is a small broad-based central and left paracentral disc bulge mildly effacing the ventral thecal sac. There is moderate right and mild-to-moderate left neural foraminal stenosis from facet arthropathy and bulging annulus.

At L4-5 level, there is a small broad-based central and right paracentral disc bulge resulting in severe right lateral recess compression. There is moderate bilateral neural foraminal stenosis from facet arthropathy and bulging annulus.

At L5-S1 level, there is a small broad-based central disc bulge without mass effect on the thecal sac. There is moderate left and mild right neural foraminal stenosis from facet arthropathy and bulging annulus.

Paraspinal soft tissues are unremarkable.
IMPRESSION: 1. Severe right lateral recess compression at L4-5 level from a small central and right paracentral disc bulge. 

2. Multilevel neural foraminal stenosis as detailed above.

## 2022-04-05 ENCOUNTER — Inpatient Hospital Stay
Admission: EM | Admit: 2022-04-05 | Discharge: 2022-04-09 | DRG: 549 | Disposition: A | Discharge status: Home / Self Care | Payer: 59 | Attending: Hospitalist | Admitting: Hospitalist

## 2022-04-05 ENCOUNTER — Other Ambulatory Visit: Payer: Self-pay

## 2022-04-05 ENCOUNTER — Emergency Department (HOSPITAL_COMMUNITY): Payer: 59

## 2022-04-05 ENCOUNTER — Encounter (HOSPITAL_COMMUNITY): Payer: Self-pay | Admitting: Emergency Medicine

## 2022-04-05 ENCOUNTER — Inpatient Hospital Stay (HOSPITAL_COMMUNITY): Payer: 59 | Admitting: Internal Medicine

## 2022-04-05 DIAGNOSIS — G8194 Hemiplegia, unspecified affecting left nondominant side: Secondary | ICD-10-CM

## 2022-04-05 DIAGNOSIS — R509 Fever, unspecified: Secondary | ICD-10-CM

## 2022-04-05 DIAGNOSIS — I69354 Hemiplegia and hemiparesis following cerebral infarction affecting left non-dominant side: Secondary | ICD-10-CM

## 2022-04-05 DIAGNOSIS — M009 Pyogenic arthritis, unspecified: Principal | ICD-10-CM | POA: Diagnosis present

## 2022-04-05 DIAGNOSIS — M79609 Pain in unspecified limb: Secondary | ICD-10-CM

## 2022-04-05 DIAGNOSIS — Z87891 Personal history of nicotine dependence: Secondary | ICD-10-CM

## 2022-04-05 DIAGNOSIS — I10 Essential (primary) hypertension: Secondary | ICD-10-CM | POA: Diagnosis present

## 2022-04-05 DIAGNOSIS — Z8673 Personal history of transient ischemic attack (TIA), and cerebral infarction without residual deficits: Secondary | ICD-10-CM

## 2022-04-05 DIAGNOSIS — M109 Gout, unspecified: Secondary | ICD-10-CM | POA: Diagnosis present

## 2022-04-05 DIAGNOSIS — M25462 Effusion, left knee: Principal | ICD-10-CM

## 2022-04-05 HISTORY — DX: Cerebral infarction, unspecified: I63.9

## 2022-04-05 LAB — CBC WITH DIFF
BASOPHIL #: 0 10*3/uL (ref 0.00–0.30)
BASOPHIL %: 0 % (ref 0–3)
EOSINOPHIL #: 0.1 10*3/uL (ref 0.00–0.80)
EOSINOPHIL %: 1 % (ref 0–7)
HCT: 35.9 % — ABNORMAL LOW (ref 42.0–51.0)
HGB: 12.2 g/dL — ABNORMAL LOW (ref 13.5–18.0)
LYMPHOCYTE #: 1.6 10*3/uL (ref 1.10–5.00)
LYMPHOCYTE %: 14 % — ABNORMAL LOW (ref 25–45)
MCH: 29.1 pg (ref 27.0–32.0)
MCHC: 33.9 g/dL (ref 32.0–36.0)
MCV: 85.8 fL (ref 78.0–99.0)
MONOCYTE #: 1 10*3/uL (ref 0.00–1.30)
MONOCYTE %: 8 % (ref 0–12)
MPV: 6.9 fL — ABNORMAL LOW (ref 7.4–10.4)
NEUTROPHIL #: 9.2 10*3/uL — ABNORMAL HIGH (ref 1.80–8.40)
NEUTROPHIL %: 78 % — ABNORMAL HIGH (ref 40–76)
PLATELETS: 346 10*3/uL (ref 140–440)
RBC: 4.18 10*6/uL — ABNORMAL LOW (ref 4.20–6.00)
RDW: 14.5 % (ref 11.6–14.8)
WBC: 11.9 10*3/uL — ABNORMAL HIGH (ref 4.0–10.5)
WBCS UNCORRECTED: 11.9 10*3/uL

## 2022-04-05 LAB — COMPREHENSIVE METABOLIC PANEL, NON-FASTING
ALBUMIN/GLOBULIN RATIO: 1 (ref 0.8–1.4)
ALBUMIN: 3.7 g/dL (ref 3.5–5.7)
ALKALINE PHOSPHATASE: 51 U/L (ref 34–104)
ALT (SGPT): 16 U/L (ref 7–52)
ANION GAP: 10 mmol/L (ref 10–20)
AST (SGOT): 11 U/L — ABNORMAL LOW (ref 13–39)
BILIRUBIN TOTAL: 0.5 mg/dL (ref 0.3–1.2)
BUN/CREA RATIO: 14 (ref 6–22)
BUN: 19 mg/dL (ref 7–25)
CALCIUM, CORRECTED: 9.9 mg/dL (ref 8.9–10.8)
CALCIUM: 9.6 mg/dL (ref 8.6–10.3)
CHLORIDE: 100 mmol/L (ref 98–107)
CO2 TOTAL: 24 mmol/L (ref 21–31)
CREATININE: 1.4 mg/dL — ABNORMAL HIGH (ref 0.60–1.30)
ESTIMATED GFR: 60 mL/min/{1.73_m2} (ref >59)
GLOBULIN: 3.8 (ref 2.9–5.4)
GLUCOSE: 129 mg/dL — ABNORMAL HIGH (ref 74–109)
OSMOLALITY, CALCULATED: 272 mOsm/kg (ref 270–290)
POTASSIUM: 3.8 mmol/L (ref 3.5–5.1)
PROTEIN TOTAL: 7.5 g/dL (ref 6.4–8.9)
SODIUM: 134 mmol/L — ABNORMAL LOW (ref 136–145)

## 2022-04-05 LAB — SYNOVIAL FLUID DIFFERENTIAL
NEUTROPHIL %: 92 %
OTHER CELL %: 8 %

## 2022-04-05 LAB — SYNOVIAL FLUID: NUCLEATED CELLS, FLUID: 14488 /uL

## 2022-04-05 LAB — LACTIC ACID LEVEL W/ REFLEX FOR LEVEL >2.0: LACTIC ACID: 1.1 mmol/L (ref 0.5–2.2)

## 2022-04-05 LAB — URIC ACID: URIC ACID: 5 mg/dL (ref 2.3–7.6)

## 2022-04-05 MED ORDER — SODIUM CHLORIDE 0.9 % IV BOLUS
500.0000 mL | INJECTION | Status: AC
Start: 2022-04-05 — End: 2022-04-06
  Administered 2022-04-05: 500 mL via INTRAVENOUS
  Administered 2022-04-06: 0 mL via INTRAVENOUS

## 2022-04-05 MED ORDER — SODIUM CHLORIDE 0.9 % INTRAVENOUS SOLUTION
INTRAVENOUS | Status: DC
Start: 2022-04-06 — End: 2022-04-06
  Administered 2022-04-06: 0 mL via INTRAVENOUS

## 2022-04-05 MED ORDER — SODIUM CHLORIDE 0.9 % (FLUSH) INJECTION SYRINGE
3.0000 mL | INJECTION | INTRAMUSCULAR | Status: DC | PRN
Start: 2022-04-05 — End: 2022-04-09

## 2022-04-05 MED ORDER — SODIUM CHLORIDE 0.9 % INTRAVENOUS PIGGYBACK
2.0000 g | INTRAVENOUS | Status: AC
Start: 2022-04-06 — End: 2022-04-06
  Administered 2022-04-06: 0 g via INTRAVENOUS
  Administered 2022-04-06: 2 g via INTRAVENOUS

## 2022-04-05 MED ORDER — LIDOCAINE HCL 20 MG/ML (2 %) INJECTION SOLUTION
15.00 mL | INTRAMUSCULAR | Status: AC
Start: 2022-04-05 — End: 2022-04-05
  Administered 2022-04-05: 300 mg via INTRADERMAL

## 2022-04-05 MED ORDER — SODIUM CHLORIDE 0.9 % (FLUSH) INJECTION SYRINGE
3.0000 mL | INJECTION | Freq: Three times a day (TID) | INTRAMUSCULAR | Status: DC
Start: 2022-04-05 — End: 2022-04-09
  Administered 2022-04-05 – 2022-04-06 (×2): 3 mL
  Administered 2022-04-06: 0 mL
  Administered 2022-04-06: 3 mL
  Administered 2022-04-07 (×2): 0 mL
  Administered 2022-04-07 – 2022-04-08 (×2): 3 mL
  Administered 2022-04-08: 0 mL
  Administered 2022-04-08: 3 mL
  Administered 2022-04-09: 0 mL

## 2022-04-05 MED ORDER — LIDOCAINE HCL 20 MG/ML (2 %) INJECTION SOLUTION
INTRAMUSCULAR | Status: AC
Start: 2022-04-05 — End: 2022-04-05
  Filled 2022-04-05: qty 20

## 2022-04-05 NOTE — ED Provider Notes (Signed)
[facility]  ED Primary Provider Note  Patient Name: Troy Rose  Patient Age: 51 y.o.  Date of Birth: July 02, 1971    Chief Complaint: Leg Pain (Left leg behind knee)        History of Present Illness       Troy Rose is a 51 y.o. male who had concerns including Leg Pain.     HPI     Chief Complaint: Knee pain    HPI:   Symptoms: Patient has left knee pain.  He states that he has been walking on it since getting out of the hospital to try to exercise and it is very painful now.  He states that swollen but not hot and red.  He states it hurts worse behind the left knee.  Location: Left popliteal area  Radiation: Left calf  Severity: Moderate to severe  Quality: Tightness and swelling  Duration: Const  Onset: Acute  Timing: 2 days  Context: Patient is currently not on any anticoagulation  Mechanism: The patient just got out of the hospital.  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  [facility] as [facility].  Additional Symptoms: Patient's blood pressure has been running high lately.  The family states they have been getting conflicting instructions.  The wife states that the vascular surgeon wants the blood pressure around 789 systolic.  Neurology did not give a guideline but states they want it higher than that.  Patient does have some difficulty talking at baseline after his stroke.  He has no difficulty walking.  He states that he is actually rehabilitating very well.  He denies any chest pain.  He denies any palpitations or fast heart rate.  Patient denies any syncope or presyncopal episodes.  Patient denies any shortness of breath or hemoptysis.    Social History  Smoking: Patient quit smoking in the past  Alcohol: None  Ilicit Drugs: None  Patient is married and has good family support    Past Medical History:Reviewed and listed in the chart below.    Past Surgical History: Reviewed and listed on the chart  below.    Allergies: Reviewed and listed on the chart    Medications: Reviewed and listed on the chart.    The nursing notes were reviewed and agreed upon unless otherwise stated    The vital signs were reviewed and are listed on the chart      Review of Systems       ROS: No other overt Review of Systems are noted to be positive except noted in the HPI.        Physical Exam   ED Triage Vitals [04/05/22 1925]   BP (Non-Invasive) (!) 187/135   Heart Rate (!) 117   Respiratory Rate 18   Temperature (!) 38.2 C (100.7 F)   SpO2 96 %   Weight 109 kg (240 lb)   Height 1.829 m (6')         Physical Exam   Physical exam  Constitutional/general:     Pleasant 51 year old male who is in no acute distress in no acute distress.  He appears to be uncomfortable but no severe pain.  HEENT: Eyes show normal extraocular movements.  Well-hydrated oral mucosa is noted.  There is no facial trauma or abnormalities.  Neck: No midline tenderness, no meningeal signs.  Full range of motion.  No JVD.  respiratory: No respiratory distress.  Patient speaks in full complete sentences  GI: Abdomen is soft non tender normal bowel sounds are auscultated.  There is no rebound tenderness or guarding.  Mildly obese abdomen BMI of 33.  Neuro cranial nerves II through XII are intact and normal.  Patient has mild dysarthria and mild difficulty expressing what he wants to say..  There is no muscle weakness in any extremities.  Sensation is intact throughout.  Psych: Patient is alert and oriented person place and time.  Patient is very pleasant converse with has a euthymic affect.  There are no signs of depression or anxiety.  Skin: No rash.  No petechiae or purpura.  The skin is warm and dry without diaphoresis.  There is no pallor.  Musculoskeletal: Patient has a significant effusion to the left knee.  There is tenderness to palpation to the medial tibial plateau as well as the medial aspect of the distal femur.  He has significant tenderness palpation  to the left popliteal area.  There is only minimal tenderness palpation to the left calf.  There is no other tenderness palpation in any other body region.    GU: Deferred       Arthrocentesis    Date/Time: 04/05/2022 9:57 PM  Performed by: Darryll Capers, DO  Authorized by: Darryll Capers, DO     Consent:     Consent obtained:  Verbal    Consent given by:  Patient    Risks, benefits, and alternatives were discussed: yes      Risks discussed:  Bleeding, infection and pain    Alternatives discussed:  No treatment and delayed treatment  Universal protocol:     Patient identity confirmed:  Verbally with patient  Location:     Location:  Knee    Knee:  L knee  Anesthesia:     Anesthesia method:  Local infiltration    Local anesthetic:  Lidocaine 2% w/o epi  Procedure details:     Preparation: Patient was prepped and draped in usual sterile fashion      Needle gauge:  18 G    Ultrasound guidance: no      Approach:  Lateral    Aspirate amount:  25 cc    Aspirate characteristics:  Yellow and cloudy    Steroid injected: no      Specimen collected: yes    Post-procedure details:     Dressing:  Adhesive bandage    Procedure completion:  Tolerated well, no immediate complications      Sample sent to lab for --Crystal id, C&S, Cell count and diff    Patient Data     Labs Ordered/Reviewed   COMPREHENSIVE METABOLIC PANEL, NON-FASTING - Abnormal; Notable for the following components:       Result Value    SODIUM 134 (*)     CREATININE 1.40 (*)     GLUCOSE 129 (*)     AST (SGOT) 11 (*)     All other components within normal limits    Narrative:     Estimated Glomerular Filtration Rate (eGFR) is calculated using the CKD-EPI (2021) equation, intended for patients 65 years of age and older. If gender is not documented or "unknown", there will be no eGFR calculation.   CBC WITH DIFF - Abnormal; Notable for the following components:    WBC 11.9 (*)     RBC 4.18 (*)     HGB 12.2 (*)     HCT 35.9 (*)     MPV 6.9 (*)  NEUTROPHIL % 78 (*)      LYMPHOCYTE % 14 (*)     NEUTROPHIL # 9.20 (*)     All other components within normal limits   URIC ACID - Normal   LACTIC ACID LEVEL W/ REFLEX FOR LEVEL >2.0 - Normal   STERILE SITE CULTURE AND GRAM STAIN, AEROBIC   ADULT ROUTINE BLOOD CULTURE, SET OF 2 BOTTLES (BACTERIA AND YEAST)   ADULT ROUTINE BLOOD CULTURE, SET OF 2 BOTTLES (BACTERIA AND YEAST)   CELL CNT JOINT/SYNOVIAL FLUID    Narrative:     The following orders were created for panel order CELL CNT JOINT/SYNOVIAL FLUID.  Procedure                               Abnormality         Status                     ---------                               -----------         ------                     SYNOVIAL FYBOF[[id number]]                                   Final result               SYNOVIAL FLUID DIFFERENTIAL[[id number]]                      Final result                 Please view results for these tests on the individual orders.   SYNOVIAL FLUID   SYNOVIAL FLUID DIFFERENTIAL   CBC/DIFF    Narrative:     The following orders were created for panel order CBC/DIFF.  Procedure                               Abnormality         Status                     ---------                               -----------         ------                     CBC WITH DPOE[[id number]]                Abnormal            Final result                 Please view results for these tests on the individual orders.   CRYSTALS    Narrative:     The following orders were created for panel order CRYSTALS.  Procedure                               Abnormality  Status                     ---------                               -----------         ------                     SYNOVIAL (JOINT) FLUID C.Marland KitchenMarland Kitchen[[id number]]                      In process                   Please view results for these tests on the individual orders.   SYNOVIAL (JOINT) FLUID CRYSTALS   EXTRA TUBES    Narrative:     The following orders were created for panel order EXTRA TUBES.  Procedure                                Abnormality         Status                     ---------                               -----------         ------                     BLUE TOP IOXB[[id number]]                                    In process                   Please view results for these tests on the individual orders.   BLUE TOP TUBE     PATIENT NAME: Romano, Stigger MED REC NO: [id number]   BIRTH DATE: 01/22/1971 ORDER: [id number]   SEX: M ORDER FROM: [facility]   REQUESTING PHYS: Darryll Capers SERVICE DATE: 04/05/2022  8:30 PM    ATTENDING Oris DroneDarryll Capers REPORT DATE: 04/05/2022  8:34 PM   REASON: Popliteal pain     Final  VENOUS DUPLEX LOWER LEFT 19417                      Study Result    Narrative & Impression     .  No DVT         Scans on Order [id number]                    This study was interpreted by: Ileene Hutchinson, MD   This report has been reviewed and released by: Signed by: Ileene Hutchinson, MD on 04/05/2022  8:34 PM           No orders to display       Medical Decision Making          Medical Decision Making  Effusion of left knee joint: acute illness or injury  H/O: CVA (cerebrovascular accident): chronic illness or injury  Hypertension, unspecified type: chronic illness or injury  Left hemiparesis (CMS [facility]): chronic illness or injury  Popliteal pain: acute  illness or injury  Amount and/or Complexity of Data Reviewed  Labs: ordered. Decision-making details documented in ED Course.  Radiology: ordered. Decision-making details documented in ED Course.          Decision made complexity: The patient's white blood cell count in the knee is 14,488.  In and of itself, this does not constitute an infected knee.  However, the patient also has a temperature of 100.8 with no other source.  Furthermore, I cannot get crystal interpretation tonight.  I spoke with orthopedic surgery on-call-Greg Southers.  He suggest keeping the patient overnight on medicine service and awaiting the crystal count tomorrow as well as the Gram stain.  He states that  orthopedic surgery be happy to consult.  At 2300 I have ordered blood cultures, WBC and CMP.  Also ordered a lactic acid and I am giving him antibiotics by IV.  At 2340: I did another evaluation in the chart and I realized the patient is actually mildly tachycardic.  I overlooked this earlier but I do feel that given the elevated white cell count in the joint space, fever of 100.8 and tachycardia he does need to be evaluated.  Additionally I am starting him on cefazolin 2 g IV.        ED Course as of 04/06/22 0002   Sun Apr 05, 2022   2238 NUCLEATED CELLS: 14,488         Medications Administered in the ED   NS flush syringe (3 mL Intracatheter Given 04/05/22 2300)   NS flush syringe (has no administration in time range)   NS bolus infusion 500 mL (has no administration in time range)   lidocaine 2% injection (300 mg Intradermal Given 04/05/22 2150)                    Clinical Impression   Popliteal pain   Effusion of left knee joint (Primary)   H/O: CVA (cerebrovascular accident)   Left hemiparesis (CMS HCC)   Hypertension, unspecified type   Fever   Septic arthritis (CMS [facility])           Admitted      Current Discharge Medication List                _______________________________  Darryll Capers, D.O.  Emergency Medicine  [place]

## 2022-04-05 NOTE — ED Triage Notes (Signed)
[facility] called for left leg pain behind the left knee. Pt informed ems "that he has a hx of DVT's. With hx of strokes as well." BS 116

## 2022-04-06 ENCOUNTER — Encounter (HOSPITAL_COMMUNITY): Payer: Self-pay | Admitting: Internal Medicine

## 2022-04-06 ENCOUNTER — Inpatient Hospital Stay (HOSPITAL_COMMUNITY): Payer: 59

## 2022-04-06 DIAGNOSIS — M79672 Pain in left foot: Secondary | ICD-10-CM

## 2022-04-06 DIAGNOSIS — M009 Pyogenic arthritis, unspecified: Principal | ICD-10-CM

## 2022-04-06 DIAGNOSIS — M109 Gout, unspecified: Secondary | ICD-10-CM

## 2022-04-06 DIAGNOSIS — Z8673 Personal history of transient ischemic attack (TIA), and cerebral infarction without residual deficits: Secondary | ICD-10-CM

## 2022-04-06 LAB — BLUE TOP TUBE

## 2022-04-06 LAB — URINALYSIS, MACROSCOPIC
BILIRUBIN: NEGATIVE mg/dL
BLOOD: NEGATIVE mg/dL
GLUCOSE: NEGATIVE mg/dL
KETONES: NEGATIVE mg/dL
LEUKOCYTES: NEGATIVE WBCs/uL
NITRITE: NEGATIVE
PH: 6.5 (ref 5.0–9.0)
PROTEIN: 30 mg/dL — AB
SPECIFIC GRAVITY: 1.013 (ref 1.002–1.030)
UROBILINOGEN: NORMAL mg/dL

## 2022-04-06 LAB — URINALYSIS, MICROSCOPIC
BACTERIA: NEGATIVE /hpf
RBCS: 2 /hpf (ref <4)
WBCS: <1 /hpf (ref <6)

## 2022-04-06 LAB — SYNOVIAL (JOINT) FLUID CRYSTALS

## 2022-04-06 MED ORDER — SODIUM CHLORIDE 0.9 % INTRAVENOUS PIGGYBACK
INJECTION | INTRAVENOUS | Status: AC
Start: 2022-04-06 — End: 2022-04-06
  Filled 2022-04-06: qty 100

## 2022-04-06 MED ORDER — VANCOMYCIN IV - PHARMACIST TO DOSE PER PROTOCOL
Freq: Every day | Status: DC | PRN
Start: 2022-04-06 — End: 2022-04-09

## 2022-04-06 MED ORDER — CEFAZOLIN 1 GRAM SOLUTION FOR INJECTION
INTRAMUSCULAR | Status: AC
Start: 2022-04-06 — End: 2022-04-06
  Filled 2022-04-06: qty 20

## 2022-04-06 MED ORDER — ACETAMINOPHEN 325 MG TABLET
650.0000 mg | ORAL_TABLET | Freq: Four times a day (QID) | ORAL | Status: DC | PRN
Start: 2022-04-06 — End: 2022-04-09

## 2022-04-06 MED ORDER — CEFTRIAXONE 2 GRAM SOLUTION FOR INJECTION
INTRAMUSCULAR | Status: AC
Start: 2022-04-06 — End: 2022-04-06
  Filled 2022-04-06: qty 20

## 2022-04-06 MED ORDER — ALLOPURINOL 100 MG TABLET
100.0000 mg | ORAL_TABLET | Freq: Every day | ORAL | Status: DC
Start: 2022-04-06 — End: 2022-04-09
  Administered 2022-04-06 – 2022-04-09 (×4): 100 mg via ORAL
  Filled 2022-04-06 (×4): qty 1

## 2022-04-06 MED ORDER — HYDROCODONE 5 MG-ACETAMINOPHEN 325 MG TABLET
1.0000 | ORAL_TABLET | Freq: Four times a day (QID) | ORAL | Status: DC | PRN
Start: 2022-04-06 — End: 2022-04-09
  Administered 2022-04-06 – 2022-04-09 (×5): 1 via ORAL
  Filled 2022-04-06 (×5): qty 1

## 2022-04-06 MED ORDER — METOPROLOL TARTRATE 25 MG TABLET
25.0000 mg | ORAL_TABLET | Freq: Two times a day (BID) | ORAL | Status: DC
Start: 2022-04-06 — End: 2022-04-09
  Administered 2022-04-06 – 2022-04-07 (×5): 25 mg via ORAL
  Administered 2022-04-08: 0 mg via ORAL
  Administered 2022-04-08 – 2022-04-09 (×2): 25 mg via ORAL
  Filled 2022-04-06 (×8): qty 1

## 2022-04-06 MED ORDER — SODIUM CHLORIDE 0.9 % INTRAVENOUS PIGGYBACK
2.0000 g | INTRAVENOUS | Status: DC
Start: 2022-04-06 — End: 2022-04-09
  Administered 2022-04-06: 2 g via INTRAVENOUS
  Administered 2022-04-06: 0 g via INTRAVENOUS
  Administered 2022-04-07: 2 g via INTRAVENOUS
  Administered 2022-04-07 – 2022-04-08 (×2): 0 g via INTRAVENOUS
  Administered 2022-04-08 – 2022-04-09 (×2): 2 g via INTRAVENOUS
  Administered 2022-04-09: 0 g via INTRAVENOUS
  Filled 2022-04-06 (×3): qty 20

## 2022-04-06 MED ORDER — SODIUM CHLORIDE 0.9 % INTRAVENOUS SOLUTION
INTRAVENOUS | Status: DC
Start: 2022-04-06 — End: 2022-04-07
  Administered 2022-04-06 – 2022-04-07 (×2): 0 mL via INTRAVENOUS

## 2022-04-06 MED ORDER — VANCOMYCIN 10 GRAM INTRAVENOUS SOLUTION
15.0000 mg/kg | Freq: Two times a day (BID) | INTRAVENOUS | Status: DC
Start: 2022-04-06 — End: 2022-04-09
  Administered 2022-04-06 (×2): 1250 mg via INTRAVENOUS
  Administered 2022-04-06 (×2): 0 mg via INTRAVENOUS
  Administered 2022-04-07 (×2): 1250 mg via INTRAVENOUS
  Administered 2022-04-07 (×2): 0 mg via INTRAVENOUS
  Administered 2022-04-08: 1250 mg via INTRAVENOUS
  Administered 2022-04-08 (×2): 0 mg via INTRAVENOUS
  Administered 2022-04-08 – 2022-04-09 (×2): 1250 mg via INTRAVENOUS
  Administered 2022-04-09: 0 mg via INTRAVENOUS
  Filled 2022-04-06 (×10): qty 12.5

## 2022-04-06 MED ORDER — HEPARIN (PORCINE) 5,000 UNIT/ML INJECTION SOLUTION
5000.0000 [IU] | Freq: Three times a day (TID) | INTRAMUSCULAR | Status: DC
Start: 2022-04-06 — End: 2022-04-06
  Administered 2022-04-06 (×2): 5000 [IU] via SUBCUTANEOUS
  Filled 2022-04-06 (×2): qty 1

## 2022-04-06 MED ORDER — ENALAPRILAT 1.25 MG/ML INTRAVENOUS SOLUTION
0.6250 mg | INTRAVENOUS | Status: AC
Start: 2022-04-06 — End: 2022-04-06
  Administered 2022-04-06: 0.625 mg via INTRAVENOUS
  Filled 2022-04-06: qty 2

## 2022-04-06 MED ORDER — LISINOPRIL 5 MG TABLET
10.0000 mg | ORAL_TABLET | Freq: Every day | ORAL | Status: DC
Start: 2022-04-06 — End: 2022-04-07
  Administered 2022-04-06 – 2022-04-07 (×2): 10 mg via ORAL
  Filled 2022-04-06 (×2): qty 2

## 2022-04-06 NOTE — ED Nurses Note (Signed)
Report called to Anderson Malta, RN on [place].

## 2022-04-06 NOTE — Progress Notes (Signed)
[place] MEDICINE [facility]  [facility]  IP PROGRESS NOTE      Lopez, Dentinger  Date of Admission:  04/05/2022  Date of Birth:  03-15-1971  Date of Service:  04/06/2022    Hospital Day:  LOS: 0 days     Subjective:   Patient seen examined for follow-up of left septic knee.    Vital Signs:  Temp (24hrs) Max:38.2 C (100.8 F)      Temperature: 37.4 C (99.4 F)  BP (Non-Invasive): (!) 141/93  MAP (Non-Invasive): 117 mmHG  Heart Rate: 79  Respiratory Rate: 17  SpO2: 92 %    Current Medications:  acetaminophen (TYLENOL) tablet, 650 mg, Oral, Q6H PRN  allopurinol (ZYLOPRIM) tablet, 100 mg, Oral, Daily  cefTRIAXone (ROCEPHIN) 2 g in NS 50 mL IVPB minibag, 2 g, Intravenous, Q24H  heparin 5,000 unit/mL injection, 5,000 Units, Subcutaneous, Q8HRS  HYDROcodone-acetaminophen (NORCO) 5-325 mg per tablet, 1 Tablet, Oral, Q6H PRN  lisinopril (PRINIVIL) tablet, 10 mg, Oral, Daily  metoprolol tartrate (LOPRESSOR) tablet, 25 mg, Oral, 2x/day  NS flush syringe, 3 mL, Intracatheter, Q8HRS  NS flush syringe, 3 mL, Intracatheter, Q1H PRN  NS premix infusion, , Intravenous, Continuous  vancomycin (VANCOCIN) 1,250 mg in NS 250 mL IVPB, 15 mg/kg (Adjusted), Intravenous, Q12H  Vancomycin IV - Pharmacist to Dose per Protocol, , Does not apply, Daily PRN        Current Orders:  Active Orders   Lab    VANCOMYCIN, TROUGH     Frequency: ONE TIME     Number of Occurrences: 1 Occurrences   Diet    DIET REGULAR Do you want to initiate MNT Protocol? Yes     Frequency: All Meals     Number of Occurrences: 1 Occurrences   Nursing    ACTIVITY Activity: OOB WITH ASSISTANCE     Frequency: UNTIL DISCONTINUED     Number of Occurrences: Until Specified    GOWN & UNDERGARMENTS ONLY     Frequency: CONTINUOUS X 72 HRS     Number of Occurrences: 72 Hours    INTAKE AND OUTPUT QSHIFT     Frequency: QSHIFT     Number of Occurrences: Until Specified    Notify MD Vital Signs     Frequency: PRN     Number of Occurrences: Until Specified     NURSE TO ENTER SECONDARY ORDER Other - (specify in comments) (CHEST PAIN AND/OR ARRYTHMIA)     Frequency: UNTIL DISCONTINUED     Number of Occurrences: Until Specified    PROVIDE TRAY AT PATIENT BEDSIDE     Frequency: ONE TIME     Number of Occurrences: 1 Occurrences     Order Comments: For arthrocentesis      PT IS MEDIUM RISK FOR VENOUS THROMBOEMBOLISM     Frequency: CONTINUOUS     Number of Occurrences: Until Specified    PULSE OXIMETRY Q4H     Frequency: Q4H     Number of Occurrences: Until Specified    SURGICAL/PROCEDURAL SITE PREP     Frequency: ONE TIME     Number of Occurrences: 1 Occurrences    TELEMETRY MONITORING - Continuous     Frequency: CONTINUOUS     Number of Occurrences: Until Specified    VITAL SIGNS Q4H     Frequency: Q4H     Number of Occurrences: Until Specified   Code Status    FULL CODE     Frequency: CONTINUOUS     Number of  Occurrences: Until Specified   Consult    IP CONSULT TO ORTHOPEDICS On-Call Provider (nurse/clerk to determine)     Frequency: ONE TIME     Number of Occurrences: 1 Occurrences   IV    INSERT & MAINTAIN PERIPHERAL IV ACCESS     Frequency: UNTIL DISCONTINUED     Number of Occurrences: Until Specified    PERIPHERAL IV DRESSING CHANGE     Frequency: PRN     Number of Occurrences: Until Specified   Medications    acetaminophen (TYLENOL) tablet     Frequency: Q6H PRN     Dose: 650 mg     Route: Oral    allopurinol (ZYLOPRIM) tablet     Frequency: Daily     Dose: 100 mg     Route: Oral    cefTRIAXone (ROCEPHIN) 2 g in NS 50 mL IVPB minibag     Frequency: Q24H     Dose: 2 g     Route: Intravenous    heparin 5,000 unit/mL injection     Frequency: Q8HRS     Dose: 5,000 Units     Route: Subcutaneous    HYDROcodone-acetaminophen (NORCO) 5-325 mg per tablet     Frequency: Q6H PRN     Dose: 1 Tablet     Route: Oral    lisinopril (PRINIVIL) tablet     Frequency: Daily     Dose: 10 mg     Route: Oral    metoprolol tartrate (LOPRESSOR) tablet     Frequency: 2x/day     Dose: 25 mg      Route: Oral    NS flush syringe     Frequency: Q8HRS     Dose: 3 mL     Route: Intracatheter    NS flush syringe     Frequency: Q1H PRN     Dose: 3 mL     Route: Intracatheter    NS premix infusion     Frequency: Continuous     Route: Intravenous    vancomycin (VANCOCIN) 1,250 mg in NS 250 mL IVPB     Frequency: Q12H     Dose: 15 mg/kg (Adjusted)     Route: Intravenous    Vancomycin IV - Pharmacist to Dose per Protocol     Frequency: Daily PRN     Route: Does not apply        Review of Systems:  Focused review of system was completed. Refer to the HPI for ROS details.     Today's Physical Exam:  Physical Exam  Cardiovascular:      Rate and Rhythm: Normal rate and regular rhythm.   Pulmonary:      Effort: Pulmonary effort is normal.      Breath sounds: Normal breath sounds.   Abdominal:      General: Abdomen is flat.      Palpations: Abdomen is soft.   Musculoskeletal:         General: Swelling and tenderness present.      Left lower leg: Edema present.   Skin:     General: Skin is warm and dry.   Neurological:      Mental Status: He is oriented to person, place, and time.   Psychiatric:         Mood and Affect: Mood normal.         Behavior: Behavior normal.          I/O:  I/O last 24 hours:  Intake/Output Summary (Last 24 hours) at 04/06/2022 1250  Last data filed at 04/06/2022 1016  Gross per 24 hour   Intake 1020 ml   Output 600 ml   Net 420 ml     I/O current shift:  05/08 0700 - 05/08 1859  In: 120 [P.O.:120]  Out: 600 [Urine:600]    Labs  Please indicate ordered or reviewed)  Reviewed: I have reviewed all lab results.    Problem List:  Active Hospital Problems   (*Primary Problem)    Diagnosis   . *Septic arthritis of knee, left (CMS HCC)       Assessment/ Plan:    Left septic knee: ortho consulted, joint aspiration done in ER - culture pending.  Continue with iv abx    Hx of CVA:      Antony Contrasazelle Japleen Tornow, DO      DVT/PE Prophylaxis: Heparin    Disposition Planning: Home discharge      Advance Care Planning  Discussed:  No

## 2022-04-06 NOTE — Progress Notes (Signed)
Ortho  L KN fluid shows gout.  Cultures neg  Knee improved.     Recommend continued observation.

## 2022-04-06 NOTE — ED Nurses Note (Signed)
Patient taken to [place] by S. Hill, Therapist, sports. Cardiac monitor in place, belongings gathered, and NS infusing on transport.

## 2022-04-06 NOTE — H&P (Addendum)
[facility]  Admission H&P    Date of Service: 04/05/2022   Troy Rose, 51 y.o. male  Encounter Start Date:  04/05/2022  Inpatient Admission Date: 04/06/2022  Date of Birth:  Dec 30, 1970  PCP: Troy Spring., PA      Information Obtained from: patient   Chief Complaint:  Left knee pain    HPI: Troy Rose is a 51 y.o., White male who presents with left knee pain. He was recently admitted at [facility] for ischemic CVA with hemorrhagic conversion. He has been trying to walk around the house for exercise but two days ago developed pain behind his left knee with swelling and warmth of the knee and swelling of the lower leg.     PAST MEDICAL:   Past Medical History:   Diagnosis Date   . Acute upper GI bleed    . CVA (cerebrovascular accident) (CMS [place])    . Gout    . HTN (hypertension)            Medications Prior to Admission     Prescriptions    acetaminophen-codeine (TYLENOL #3) 300-30 mg Oral Tablet    Take 1 Tab by mouth Every 6 hours as needed    allopurinoL (ZYLOPRIM) 100 mg Oral Tablet    Take 1 Tablet (100 mg total) by mouth Once a day    amLODIPine (NORVASC) 10 mg Oral Tablet    Take 1 Tablet (10 mg total) by mouth Once a day    atorvastatin (LIPITOR) 80 mg Oral Tablet    Take 1 Tablet (80 mg total) by mouth Every evening    HYDROcodone-acetaminophen (NORCO) 5-325 mg Oral Tablet    Take 1 Tablet by mouth Every 6 hours as needed for Pain    lisinopril (PRINIVIL) 10 mg Oral Tablet    Take 10 mg by mouth Once a day    Methylprednisolone (MEDROL DOSEPACK) 4 mg Oral Tablets, Dose Pack    Take as instructed.    metoprolol tartrate (LOPRESSOR) 25 mg Oral Tablet    Take 25 mg by mouth Twice daily        Allergies   Allergen Reactions   . Motrin [Ibuprofen] GI Bleed   . Tamiflu [Oseltamivir]  Other Adverse Reaction (Add comment)     Jittery     Past Surgical History:   Procedure Laterality Date   . ESOPHAGOGASTRODUODENOSCOPY      for gi bleed   . HX ANGIOPLASTY              Family History:  HTN       Social  History:  Social History     Tobacco Use   . Smoking status: Never   . Smokeless tobacco: Never   Substance Use Topics   . Alcohol use: Never   . Drug use: Never        Review of Systems:  Review of Systems   Neurological: Negative.  Excessive daytime sleepiness: Pt had recent angiography of neck through right groin. He has left carotid stenosis and needs endarterectomy.   All other systems reviewed and are negative.       Examination:  Temperature: (!) 38.2 C (100.7 F) Heart Rate: 100 BP (Non-Invasive): (!) 152/108   Respiratory Rate: (!) 10 SpO2: 94 %     Physical Exam  Constitutional:       Appearance: Normal appearance.   HENT:      Head: Normocephalic and atraumatic.  Right Ear: External ear normal.      Left Ear: External ear normal.      Nose: Nose normal.      Mouth/Throat:      Pharynx: Oropharynx is clear.   Eyes:      Extraocular Movements: Extraocular movements intact.      Conjunctiva/sclera: Conjunctivae normal.      Pupils: Pupils are equal, round, and reactive to light.   Cardiovascular:      Rate and Rhythm: Normal rate and regular rhythm.      Pulses: Normal pulses.      Heart sounds: Normal heart sounds.   Pulmonary:      Effort: Pulmonary effort is normal.      Breath sounds: Normal breath sounds.   Abdominal:      General: Bowel sounds are normal.      Palpations: Abdomen is soft.   Musculoskeletal:         General: Swelling (left knee with warmth and tenderness upon palpation) present.      Cervical back: Normal range of motion and neck supple.      Left lower leg: Edema present.   Skin:     General: Skin is warm and dry.      Capillary Refill: Capillary refill takes less than 2 seconds.      Coloration: Skin is pale.      Findings: Erythema (lle) present.   Neurological:      General: No focal deficit present.      Mental Status: He is alert and oriented to person, place, and time.   Psychiatric:         Mood and Affect: Mood normal.         Behavior: Behavior normal.          Labs:     Lab Results Today:    Results for orders placed or performed during the hospital encounter of 04/05/22 (from the past 24 hour(s))   STERILE SITE CULTURE AND GRAM STAIN, AEROBIC - JOINT/SYNOVIAL FLUID    Specimen: Aspirate; Other   Result Value Ref Range    GRAM STAIN 4+ Many PMNs     GRAM STAIN No Organisms Seen    SYNOVIAL FLUID   Result Value Ref Range    CLARITY Cloudy     COLOR Light Yellow     NUCLEATED CELLS, FLUID 14,488 /uL   SYNOVIAL FLUID DIFFERENTIAL   Result Value Ref Range    NEUTROPHIL % 92 %    OTHER CELL % 8 %   URIC ACID   Result Value Ref Range    URIC ACID 5.0 2.3 - 7.6 mg/dL   COMPREHENSIVE METABOLIC PANEL, NON-FASTING   Result Value Ref Range    SODIUM 134 (L) 136 - 145 mmol/L    POTASSIUM 3.8 3.5 - 5.1 mmol/L    CHLORIDE 100 98 - 107 mmol/L    CO2 TOTAL 24 21 - 31 mmol/L    ANION GAP 10 10 - 20 mmol/L    BUN 19 7 - 25 mg/dL    CREATININE 1.40 (H) 0.60 - 1.30 mg/dL    BUN/CREA RATIO 14 6 - 22    ESTIMATED GFR 60 >59 mL/min/1.52m^2    ALBUMIN 3.7 3.5 - 5.7 g/dL    CALCIUM 9.6 8.6 - 10.3 mg/dL    GLUCOSE 129 (H) 74 - 109 mg/dL    ALKALINE PHOSPHATASE 51 34 - 104 U/L    ALT (SGPT) 16 7 - 52 U/L  AST (SGOT) 11 (L) 13 - 39 U/L    BILIRUBIN TOTAL 0.5 0.3 - 1.2 mg/dL    PROTEIN TOTAL 7.5 6.4 - 8.9 g/dL    ALBUMIN/GLOBULIN RATIO 1.0 0.8 - 1.4    OSMOLALITY, CALCULATED 272 270 - 290 mOsm/kg    CALCIUM, CORRECTED 9.9 8.9 - 10.8 mg/dL    GLOBULIN 3.8 2.9 - 5.4   CBC WITH DIFF   Result Value Ref Range    WBCS UNCORRECTED 11.9 x10^3/uL    WBC 11.9 (H) 4.0 - 10.5 x10^3/uL    RBC 4.18 (L) 4.20 - 6.00 x10^6/uL    HGB 12.2 (L) 13.5 - 18.0 g/dL    HCT 35.9 (L) 42.0 - 51.0 %    MCV 85.8 78.0 - 99.0 fL    MCH 29.1 27.0 - 32.0 pg    MCHC 33.9 32.0 - 36.0 g/dL    RDW 14.5 11.6 - 14.8 %    PLATELETS 346 140 - 440 x10^3/uL    MPV 6.9 (L) 7.4 - 10.4 fL    NEUTROPHIL % 78 (H) 40 - 76 %    LYMPHOCYTE % 14 (L) 25 - 45 %    MONOCYTE % 8 0 - 12 %    EOSINOPHIL % 1 0 - 7 %    BASOPHIL % 0 0 - 3 %    NEUTROPHIL # 9.20 (H)  1.80 - 8.40 x10^3/uL    LYMPHOCYTE # 1.60 1.10 - 5.00 x10^3/uL    MONOCYTE # 1.00 0.00 - 1.30 x10^3/uL    EOSINOPHIL # 0.10 0.00 - 0.80 x10^3/uL    BASOPHIL # 0.00 0.00 - 0.30 x10^3/uL   LACTIC ACID LEVEL W/ REFLEX FOR LEVEL >2.0   Result Value Ref Range    LACTIC ACID 1.1 0.5 - 2.2 mmol/L       Imaging Studies:  No results found.    DNR Status:  Full Code    Assessment/Plan:   Active Hospital Problems    Diagnosis   . Primary Problem: Septic arthritis of knee, left (CMS HCC)     Broad spectrum antibiotics, further joint aspiration studies pending. Orthopedics to see this am.     DVT/PE Prophylaxis: none, recent hemorrhage    Wilburn Mylar, DO

## 2022-04-06 NOTE — Care Plan (Signed)
Patient alert and oriented x 4. Patient lungs clear and respirations easy non labored on room air. Patient abdomen soft non-tender and BS active. Patient encouraged to reposition frequently to prevent skin breakdown and assistance provided as needed. Patient left knee elevated on pill. PRN pain medication administered x 1. Heparin discontinued, patient family reported patient not on anticoagulants at this time due to recent bleeding noted on brain back in April from stroke. Patient voiding with out difficulty via urinal. Urine sample sent. Xray of knee and foot completed. Patient continues on IV antibiotics. Fluids discontinued today. Bed in lowest position and call light within reach. Bed alarm active and patient able to make needs known.   Problem: Pain Acute  Goal: Optimal Pain Control and Function  Outcome: Ongoing (see interventions/notes)  Intervention: Optimize Psychosocial Wellbeing  Recent Flowsheet Documentation  Taken 04/06/2022 0847 by Rozelle Logan, RN  Diversional Activities:   television   smartphone

## 2022-04-06 NOTE — ED Nurses Note (Signed)
Patient's temp to be 100.8 and BP is 156/102. Dr. Orson Slick made aware.

## 2022-04-07 LAB — COMPREHENSIVE METABOLIC PANEL, NON-FASTING
ALBUMIN/GLOBULIN RATIO: 0.9 (ref 0.8–1.4)
ALBUMIN: 3.2 g/dL — ABNORMAL LOW (ref 3.5–5.7)
ALKALINE PHOSPHATASE: 44 U/L (ref 34–104)
ALT (SGPT): 11 U/L (ref 7–52)
ANION GAP: 8 mmol/L — ABNORMAL LOW (ref 10–20)
AST (SGOT): 11 U/L — ABNORMAL LOW (ref 13–39)
BILIRUBIN TOTAL: 0.5 mg/dL (ref 0.3–1.2)
BUN/CREA RATIO: 13 (ref 6–22)
BUN: 17 mg/dL (ref 7–25)
CALCIUM, CORRECTED: 10.1 mg/dL (ref 8.9–10.8)
CALCIUM: 9.3 mg/dL (ref 8.6–10.3)
CHLORIDE: 103 mmol/L (ref 98–107)
CO2 TOTAL: 24 mmol/L (ref 21–31)
CREATININE: 1.35 mg/dL — ABNORMAL HIGH (ref 0.60–1.30)
ESTIMATED GFR: 63 mL/min/{1.73_m2} (ref >59)
GLOBULIN: 3.6 (ref 2.9–5.4)
GLUCOSE: 112 mg/dL — ABNORMAL HIGH (ref 74–109)
OSMOLALITY, CALCULATED: 272 mOsm/kg (ref 270–290)
POTASSIUM: 3.8 mmol/L (ref 3.5–5.1)
PROTEIN TOTAL: 6.8 g/dL (ref 6.4–8.9)
SODIUM: 135 mmol/L — ABNORMAL LOW (ref 136–145)

## 2022-04-07 LAB — CBC WITH DIFF
BASOPHIL #: 0 10*3/uL (ref 0.00–0.30)
BASOPHIL %: 0 % (ref 0–3)
EOSINOPHIL #: 0.2 10*3/uL (ref 0.00–0.80)
EOSINOPHIL %: 2 % (ref 0–7)
HCT: 33.6 % — ABNORMAL LOW (ref 42.0–51.0)
HGB: 11.2 g/dL — ABNORMAL LOW (ref 13.5–18.0)
LYMPHOCYTE #: 1.1 10*3/uL (ref 1.10–5.00)
LYMPHOCYTE %: 11 % — ABNORMAL LOW (ref 25–45)
MCH: 28.7 pg (ref 27.0–32.0)
MCHC: 33.4 g/dL (ref 32.0–36.0)
MCV: 86 fL (ref 78.0–99.0)
MONOCYTE #: 0.9 10*3/uL (ref 0.00–1.30)
MONOCYTE %: 10 % (ref 0–12)
MPV: 6.9 fL — ABNORMAL LOW (ref 7.4–10.4)
NEUTROPHIL #: 7.2 10*3/uL (ref 1.80–8.40)
NEUTROPHIL %: 77 % — ABNORMAL HIGH (ref 40–76)
PLATELETS: 309 10*3/uL (ref 140–440)
RBC: 3.9 10*6/uL — ABNORMAL LOW (ref 4.20–6.00)
RDW: 14.9 % — ABNORMAL HIGH (ref 11.6–14.8)
WBC: 9.4 10*3/uL (ref 4.0–10.5)
WBCS UNCORRECTED: 9.4 10*3/uL

## 2022-04-07 LAB — VANCOMYCIN, TROUGH: VANCOMYCIN TROUGH: 15.7 ug/mL — ABNORMAL HIGH (ref 5.0–10.0)

## 2022-04-07 MED ORDER — METHYLPREDNISOLONE SOD SUCCINATE 40 MG/ML SOLUTION FOR INJ. WRAPPER
40.0000 mg | Freq: Once | INTRAMUSCULAR | Status: AC
Start: 2022-04-07 — End: 2022-04-07
  Administered 2022-04-07: 40 mg via INTRAVENOUS
  Filled 2022-04-07: qty 1

## 2022-04-07 MED ORDER — COLCHICINE 0.6 MG TABLET
0.6000 mg | ORAL_TABLET | Freq: Every day | ORAL | Status: AC
Start: 2022-04-07 — End: 2022-04-07
  Administered 2022-04-07: 0.6 mg via ORAL
  Filled 2022-04-07: qty 1

## 2022-04-07 MED ORDER — PREDNISONE 20 MG TABLET
40.0000 mg | ORAL_TABLET | Freq: Once | ORAL | Status: DC
Start: 2022-04-07 — End: 2022-04-07

## 2022-04-07 MED ORDER — COLCHICINE 0.6 MG TABLET
1.2000 mg | ORAL_TABLET | Freq: Every day | ORAL | Status: AC
Start: 2022-04-07 — End: 2022-04-07
  Administered 2022-04-07: 1.2 mg via ORAL
  Filled 2022-04-07: qty 2

## 2022-04-07 NOTE — Progress Notes (Signed)
[place] MEDICINE [facility]  [facility]  IP PROGRESS NOTE      Troy Rose, Troy Rose  Date of Admission:  04/05/2022  Date of Birth:  [birth year]  Date of Service:  04/07/2022    Hospital Day:  LOS: 1 day     Subjective:   Patient seen examined for follow-up of left septic knee.  Patient had evaluation by Orthopedic surgery, who is recommending treatment for gout.  Crystals were noted in the synovial fluid.  Cultures so far have been negative.  Patient does have known history of gout, takes allopurinol daily at home, and states he has colchicine that he has taken in the past.    Vital Signs:  Temp (24hrs) Max:38.3 C (101 F)      Temperature: 36.9 C (98.4 F)  BP (Non-Invasive): (!) 159/95  MAP (Non-Invasive): 117 mmHG  Heart Rate: 90  Respiratory Rate: 18  SpO2: 93 %    Current Medications:  acetaminophen (TYLENOL) tablet, 650 mg, Oral, Q6H PRN  allopurinol (ZYLOPRIM) tablet, 100 mg, Oral, Daily  cefTRIAXone (ROCEPHIN) 2 g in NS 50 mL IVPB minibag, 2 g, Intravenous, Q24H  colchicine tablet, 1.2 mg, Oral, Daily  colchicine tablet, 0.6 mg, Oral, Daily  HYDROcodone-acetaminophen (NORCO) 5-325 mg per tablet, 1 Tablet, Oral, Q6H PRN  methylPREDNISolone sod succ (SOLU-MEDROL) 40 mg/mL injection, 40 mg, Intravenous, Once  metoprolol tartrate (LOPRESSOR) tablet, 25 mg, Oral, 2x/day  NS flush syringe, 3 mL, Intracatheter, Q8HRS  NS flush syringe, 3 mL, Intracatheter, Q1H PRN  vancomycin (VANCOCIN) 1,250 mg in NS 250 mL IVPB, 15 mg/kg (Adjusted), Intravenous, Q12H  Vancomycin IV - Pharmacist to Dose per Protocol, , Does not apply, Daily PRN        Current Orders:  Active Orders   Lab    BASIC METABOLIC PANEL     Frequency: 3086 - AM DRAW     Number of Occurrences: 1 Occurrences    CBC/DIFF     Frequency: 0530 - AM DRAW     Number of Occurrences: 1 Occurrences    MAGNESIUM     Frequency: 0530 - AM DRAW     Number of Occurrences: 1 Occurrences    VANCOMYCIN, TROUGH     Frequency: ONE TIME     Number of  Occurrences: 1 Occurrences   Diet    DIET REGULAR Do you want to initiate MNT Protocol? Yes     Frequency: All Meals     Number of Occurrences: 1 Occurrences   Nursing    ACTIVITY Activity: OOB WITH ASSISTANCE     Frequency: UNTIL DISCONTINUED     Number of Occurrences: Until Specified    GOWN & UNDERGARMENTS ONLY     Frequency: CONTINUOUS X 72 HRS     Number of Occurrences: 72 Hours    INTAKE AND OUTPUT QSHIFT     Frequency: QSHIFT     Number of Occurrences: Until Specified    Notify MD Vital Signs     Frequency: PRN     Number of Occurrences: Until Specified    NURSE TO ENTER SECONDARY ORDER Other - (specify in comments) (CHEST PAIN AND/OR ARRYTHMIA)     Frequency: UNTIL DISCONTINUED     Number of Occurrences: Until Specified    PROVIDE TRAY AT PATIENT BEDSIDE     Frequency: ONE TIME     Number of Occurrences: 1 Occurrences     Order Comments: For arthrocentesis      PT IS MEDIUM RISK FOR VENOUS  THROMBOEMBOLISM     Frequency: CONTINUOUS     Number of Occurrences: Until Specified    PULSE OXIMETRY Q4H     Frequency: Q4H     Number of Occurrences: Until Specified    SURGICAL/PROCEDURAL SITE PREP     Frequency: ONE TIME     Number of Occurrences: 1 Occurrences    TELEMETRY MONITORING - Continuous     Frequency: CONTINUOUS     Number of Occurrences: Until Specified    VITAL SIGNS Q4H     Frequency: Q4H     Number of Occurrences: Until Specified   Code Status    FULL CODE     Frequency: CONTINUOUS     Number of Occurrences: Until Specified   Consult    IP CONSULT TO ORTHOPEDICS On-Call Provider (nurse/clerk to determine)     Frequency: ONE TIME     Number of Occurrences: 1 Occurrences   IV    INSERT & MAINTAIN PERIPHERAL IV ACCESS     Frequency: UNTIL DISCONTINUED     Number of Occurrences: Until Specified    PERIPHERAL IV DRESSING CHANGE     Frequency: PRN     Number of Occurrences: Until Specified   Medications    acetaminophen (TYLENOL) tablet     Frequency: Q6H PRN     Dose: 650 mg     Route: Oral    allopurinol  (ZYLOPRIM) tablet     Frequency: Daily     Dose: 100 mg     Route: Oral    cefTRIAXone (ROCEPHIN) 2 g in NS 50 mL IVPB minibag     Frequency: Q24H     Dose: 2 g     Route: Intravenous    colchicine tablet     Frequency: Daily     Dose: 1.2 mg     Route: Oral    colchicine tablet     Frequency: Daily     Dose: 0.6 mg     Route: Oral    HYDROcodone-acetaminophen (NORCO) 5-325 mg per tablet     Frequency: Q6H PRN     Dose: 1 Tablet     Route: Oral    methylPREDNISolone sod succ (SOLU-MEDROL) 40 mg/mL injection     Frequency: Once     Dose: 40 mg     Route: Intravenous    metoprolol tartrate (LOPRESSOR) tablet     Frequency: 2x/day     Dose: 25 mg     Route: Oral    NS flush syringe     Frequency: Q8HRS     Dose: 3 mL     Route: Intracatheter    NS flush syringe     Frequency: Q1H PRN     Dose: 3 mL     Route: Intracatheter    vancomycin (VANCOCIN) 1,250 mg in NS 250 mL IVPB     Frequency: Q12H     Dose: 15 mg/kg (Adjusted)     Route: Intravenous    Vancomycin IV - Pharmacist to Dose per Protocol     Frequency: Daily PRN     Route: Does not apply        Review of Systems:  Focused review of system was completed. Refer to the HPI for ROS details.     Today's Physical Exam:  Physical Exam  Cardiovascular:      Rate and Rhythm: Normal rate and regular rhythm.   Pulmonary:      Effort: Pulmonary effort is normal.  Breath sounds: Normal breath sounds.   Abdominal:      General: Abdomen is flat.      Palpations: Abdomen is soft.   Musculoskeletal:         General: Swelling and tenderness present.      Left lower leg: Edema present.   Skin:     General: Skin is warm and dry.   Neurological:      Mental Status: He is oriented to person, place, and time.   Psychiatric:         Mood and Affect: Mood normal.         Behavior: Behavior normal.          I/O:  I/O last 24 hours:      Intake/Output Summary (Last 24 hours) at 04/07/2022 1154  Last data filed at 04/07/2022 1020  Gross per 24 hour   Intake 502.5 ml   Output 1200 ml   Net  -697.5 ml     I/O current shift:  05/09 0700 - 05/09 1859  In: 240 [P.O.:240]  Out: -     Labs  Please indicate ordered or reviewed)  Reviewed: I have reviewed all lab results.    Problem List:  Active Hospital Problems   (*Primary Problem)    Diagnosis   . *Septic arthritis of knee, left (CMS HCC)       Assessment/ Plan:    Left septic knee: ortho consulted, joint aspiration done in ER - culture pending.  Continue with iv abx.  Patient will be started on colchicine, and be given dose of IV Solu-Medrol.  We will await further input from Orthopedic surgery, and if cultures are negative, most likely will go home in next day or 2 with treatment for gout.  Further interventions will be based on patient's clinical course.  Hospitalist has examined the patient, as reviewed all material, agrees with the above medical management at this time.    Hx of CVA:            DVT/PE Prophylaxis: Heparin on hold for family request    Disposition Planning: Home discharge      Advance Care Planning Discussed:  No

## 2022-04-08 DIAGNOSIS — M25462 Effusion, left knee: Secondary | ICD-10-CM

## 2022-04-08 DIAGNOSIS — M109 Gout, unspecified: Secondary | ICD-10-CM | POA: Diagnosis present

## 2022-04-08 LAB — BASIC METABOLIC PANEL
ANION GAP: 8 mmol/L — ABNORMAL LOW (ref 10–20)
BUN/CREA RATIO: 17 (ref 6–22)
BUN: 22 mg/dL (ref 7–25)
CALCIUM: 9.3 mg/dL (ref 8.6–10.3)
CHLORIDE: 104 mmol/L (ref 98–107)
CO2 TOTAL: 23 mmol/L (ref 21–31)
CREATININE: 1.27 mg/dL (ref 0.60–1.30)
ESTIMATED GFR: 68 mL/min/{1.73_m2} (ref >59)
GLUCOSE: 148 mg/dL — ABNORMAL HIGH (ref 74–109)
OSMOLALITY, CALCULATED: 276 mOsm/kg (ref 270–290)
POTASSIUM: 4.5 mmol/L (ref 3.5–5.1)
SODIUM: 135 mmol/L — ABNORMAL LOW (ref 136–145)

## 2022-04-08 LAB — CBC WITH DIFF
BASOPHIL #: 0 10*3/uL (ref 0.00–0.30)
BASOPHIL %: 0 % (ref 0–3)
EOSINOPHIL #: 0 10*3/uL (ref 0.00–0.80)
EOSINOPHIL %: 0 % (ref 0–7)
HCT: 36 % — ABNORMAL LOW (ref 42.0–51.0)
HGB: 12 g/dL — ABNORMAL LOW (ref 13.5–18.0)
LYMPHOCYTE #: 0.9 10*3/uL — ABNORMAL LOW (ref 1.10–5.00)
LYMPHOCYTE %: 7 % — ABNORMAL LOW (ref 25–45)
MCH: 28.6 pg (ref 27.0–32.0)
MCHC: 33.3 g/dL (ref 32.0–36.0)
MCV: 85.7 fL (ref 78.0–99.0)
MONOCYTE #: 0.8 10*3/uL (ref 0.00–1.30)
MONOCYTE %: 7 % (ref 0–12)
MPV: 6.8 fL — ABNORMAL LOW (ref 7.4–10.4)
NEUTROPHIL #: 10.4 10*3/uL — ABNORMAL HIGH (ref 1.80–8.40)
NEUTROPHIL %: 86 % — ABNORMAL HIGH (ref 40–76)
PLATELETS: 358 10*3/uL (ref 140–440)
RBC: 4.2 10*6/uL (ref 4.20–6.00)
RDW: 14.5 % (ref 11.6–14.8)
WBC: 12.1 10*3/uL — ABNORMAL HIGH (ref 4.0–10.5)
WBCS UNCORRECTED: 12.1 10*3/uL

## 2022-04-08 LAB — VANCOMYCIN, TROUGH: VANCOMYCIN TROUGH: 19.3 ug/mL — ABNORMAL HIGH (ref 5.0–10.0)

## 2022-04-08 LAB — MAGNESIUM: MAGNESIUM: 2.4 mg/dL (ref 1.9–2.7)

## 2022-04-08 MED ORDER — ENOXAPARIN 40 MG/0.4 ML SUBCUTANEOUS SYRINGE
40.0000 mg | INJECTION | Freq: Every day | SUBCUTANEOUS | Status: DC
Start: 2022-04-08 — End: 2022-04-08

## 2022-04-08 NOTE — Progress Notes (Signed)
[place] MEDICINE [facility]    HOSPITALIST PROGRESS NOTE    Troy Rose  Date of service: 04/08/2022  Date of Admission:  04/05/2022  Hospital Day:  LOS: 2 days     Subjective:   The patient was seen and examined for hospitalist admission for left knee pain/effusion, gout flare.  The patient was lying in bed upon arrival to room.  He states that he is feeling much better.  Left knee still hurting, but not as bad.  He was able to stand with walker, but could not take any steps.  He has not worked with physical therapy yet.  Patient was in hospital for two weeks prior to this admission and is very deconditioned.  He was made aware that depending on how he did with physical therapy, he might have to go to rehab.  No other complaints or issues voiced.      Review of Systems  Other than ROS in the HPI, all other systems were negative.    Vital Signs:  Filed Vitals:    04/08/22 0804 04/08/22 0915 04/08/22 0919 04/08/22 1031   BP: (!) 139/97 138/88 (!) 139/91    Pulse: 68   68   Resp: 17      Temp: 36.8 C (98.2 F)      SpO2:            Physical Exam:  General:  Patient in NAD, resting in bed, no visitors present  Head:  Normocephalic, atraumatic  Eyes:  PERRL, anicteric sclera  ENT:  Oral mucosa moist, no nasal discharge   Neck:  Soft, supple, trachea midline  Heart:  RRR, S1 and S2 normal  Lungs:  Unlabored respirations.  Lungs are clear to auscultation bilaterally, with no wheezes, no rales, no conversational dyspnea  Abdomen:  Soft, active bowel sounds, non-tender to palpation, non-distended  Extremities:  Pulses equal bilaterally.  Capillary refill less than 3 seconds. Left leg more swollen than right leg.  Palpable fluid noted to left knee.  ROM limited left leg.    Skin:  Warm and dry, not diaphoretic.  No ecchymosis noted.   Neuro:  A&O x 3.  No focal deficits.  Speech intact  Psych:  Cooperative, not agitated    Intake & Output:    Intake/Output Summary (Last 24 hours) at 04/08/2022 1504  Last data  filed at 04/08/2022 1038  Gross per 24 hour   Intake 1020.2 ml   Output 850 ml   Net 170.2 ml     I/O current shift:  05/10 0700 - 05/10 1859  In: 508.2 [P.O.:120]  Out: -   Emesis:    BM:    Date of Last Bowel Movement: 04/05/22  Heme:      acetaminophen (TYLENOL) tablet, 650 mg, Oral, Q6H PRN  allopurinol (ZYLOPRIM) tablet, 100 mg, Oral, Daily  cefTRIAXone (ROCEPHIN) 2 g in NS 50 mL IVPB minibag, 2 g, Intravenous, Q24H  HYDROcodone-acetaminophen (NORCO) 5-325 mg per tablet, 1 Tablet, Oral, Q6H PRN  metoprolol tartrate (LOPRESSOR) tablet, 25 mg, Oral, 2x/day  NS flush syringe, 3 mL, Intracatheter, Q8HRS  NS flush syringe, 3 mL, Intracatheter, Q1H PRN  vancomycin (VANCOCIN) 1,250 mg in NS 250 mL IVPB, 15 mg/kg (Adjusted), Intravenous, Q12H  Vancomycin IV - Pharmacist to Dose per Protocol, , Does not apply, Daily PRN          Labs:  Recent Results (from the past 48 hour(s))   CBC WITH DIFF    Collection Time:  04/08/22  4:36 AM   Result Value    WBC 12.1 (H)    HGB 12.0 (L)    HCT 36.0 (L)    PLATELETS 358      Results for orders placed or performed during the hospital encounter of 04/05/22 (from the past 48 hour(s))   BASIC METABOLIC PANEL    Collection Time: 04/08/22  4:36 AM   Result Value    SODIUM 135 (L)    POTASSIUM 4.5    CHLORIDE 104    CO2 TOTAL 23    GLUCOSE 148 (H)    BUN 22    CREATININE 1.27      Recent Results (from the past 48 hour(s))   COMPREHENSIVE METABOLIC PANEL, NON-FASTING    Collection Time: 04/07/22  4:34 AM   Result Value    ALKALINE PHOSPHATASE 44    ALT (SGPT) 11    AST (SGOT) 11 (L)        Microbiology:  Hospital Encounter on 04/05/22 (from the past 96 hour(s))   STERILE SITE CULTURE AND GRAM STAIN, AEROBIC - JOINT/SYNOVIAL FLUID    Collection Time: 04/05/22  9:50 PM    Specimen: Aspirate; Other   Culture Result Status    FLC No Growth Preliminary    GRAM STAIN 4+ Many PMNs Preliminary    GRAM STAIN No Organisms Seen Preliminary   ADULT ROUTINE BLOOD CULTURE, SET OF 2 ADULT BOTTLES  (BACTERIA AND YEAST)    Collection Time: 04/05/22 11:23 PM    Specimen: Blood   Culture Result Status    BLOOD CULTURE, ROUTINE No Growth 2 Days Preliminary   ADULT ROUTINE BLOOD CULTURE, SET OF 2 ADULT BOTTLES (BACTERIA AND YEAST)    Collection Time: 04/05/22 11:23 PM    Specimen: Blood   Culture Result Status    BLOOD CULTURE, ROUTINE No Growth 2 Days Preliminary       Imaging:   XR FOOT LEFT 2 VIEW  Narrative: Maisie Fus Ode    RADIOLOGIST: Karolee Stamps    XR FOOT LEFT 2 VIEWS performed on 04/06/2022 10:06 AM    CLINICAL HISTORY: Foot pain.  LEFT FOOT PAIN/NO INJURY/SWELLING    TECHNIQUE:  2 views of the left foot.    COMPARISON:  None.    FINDINGS:   No visible fracture.  No suspicious bone lesion.  Normal alignment.  No radiopaque foreign body is identified.  Degenerative arthritic spurring of the tarsals present with very small plantar calcaneal spur and mild distal Achilles calcific enthesopathy. Kager fat pad preserved.  Impression: 1. No acute osseous abnormality identified.  2. Osteoarthritic degenerative changes.     Radiologist location ID: [id number]        Assessment/ Plan:   Active Hospital Problems   (*Primary Problem)    Diagnosis   . *Effusion of bursa of left knee   . Gout flare   . Septic arthritis of knee, left (CMS HCC)     1..  Gout flare   Uric acid 5.0.  Patient continues on allopurinol PO  2.  Effusion of bursa of left knee   Physical therapy ordered. Being followed by orthopedics.    3.  Septic arthritis left knee.    Felt to be due to gout flare instead of infection.  Vancomycin and Rocephin discontinued.    4.  DVT prophylaxis   Anticoagulation can't be started at this time due to recent history of hemorrhagic strokes       Further orders will depend upon  clinical course.  The Hospitalist personally evaluated and examined the patient in conjunction with the MLP and agree with the assessments, treatment plan and disposition of the patient as recorded by [facility].    Disposition Planning:   Patient will be evaluated by physical therapy and may have to have rehab arranged for after discharged.  Case management consulted for discharge planning needs.      Stanford Breedngela Nicole Kellina Dreese, FNP-BC  04/08/2022  [place] MEDICINE HOSPITALIST

## 2022-04-08 NOTE — PT Evaluation (Signed)
[facility]  [place], [address]  [phone]  [phone]  Rehabilitation Services  Physical Therapy Inpatient Initial Evaluation    Patient Name: Troy Rose  Date of Birth: 28-Mar-1971  Height: Height: 182.9 cm (6' 0.01")  Weight: Weight: 106 kg (234 lb 3 oz)  Room/Bed: 311/B  Payor: PEIA / Plan: PEIA/UMR / Product Type: Non Managed Care /       PMH:  Past Medical History:   Diagnosis Date    Acute upper GI bleed     CVA (cerebrovascular accident) (CMS [place])     Gout     HTN (hypertension)            Assessment:      (P) Patient admitted with painful left knee with swelling of LLE, erythema left knee difficulty with ambulation. Appears at this point to be gout, however, waiting for final results of culture of knee fluid.  He tolerated PT evaluation well and will benefit from continued PT services. Plans to discharge to his father's home with wife to assist as needed and may possibly need outpatient PT services to regain strength and ROM. Will progress as tolerated.    Discharge Needs:    Equipment Recommendation: (P) front wheeled walker, standard cane      The patient presents with mobility limitations due to impaired range of motion, impaired strength, weight bearing restrictions, impaired functional activity tolerance, and pain  that significantly impair/prevent patient's ability to participate in mobility-related activities of daily living (MRADLs) including  ambulation and transfers in order to safely complete, toileting, bathing, safely entering/exiting the home. This functional mobility deficit can be sufficiently resolved with the use of a (P) front wheeled walker, standard cane  in order to decrease the risk of falls, morbidity, and mortality in performance of these MRADLs.  Patient is able to safely use this assistive device.    Discharge Disposition: (P) home with assist, home with outpatient services    JUSTIFICATION OF DISCHARGE  RECOMMENDATION   Based on current diagnosis, functional performance prior to admission, and current functional performance, this patient requires continued PT services in (P) home with assist, home with outpatient services in order to achieve significant functional improvements in these deficit areas: (P) aerobic capacity/endurance, gait, locomotion, and balance, joint integrity and mobility, muscle performance, ROM (range of motion).        Plan:   Current Intervention: (P) bed mobility training, gait training, home exercise program, joint mobilization, manual therapy techniques, patient/family education, ROM (range of motion), strengthening, stretching, transfer training  To provide physical therapy services (P) other (see comments) (1-3x daily Monday through Saturday)  for duration of (P) until discharge.    The risks/benefits of therapy have been discussed with the patient/caregiver and he/she is in agreement with the established plan of care.       Subjective & Objective     Past Medical History:   Diagnosis Date    Acute upper GI bleed     CVA (cerebrovascular accident) (CMS [place])     Gout     HTN (hypertension)             Past Surgical History:   Procedure Laterality Date    ESOPHAGOGASTRODUODENOSCOPY      for gi bleed    HX ANGIOPLASTY          04/08/22 1150   Rehab Session   Document Type evaluation   Total PT Minutes: 25  Patient Effort good   General Information   Patient Profile Reviewed yes   Pertinent History of Current Functional Problem Patient recently discharged from [facility] following hospitalization for ischemic CVA. He has been walking in the house and yard for exercise until he developed pain 2 days ago in the left knee, particularly, on the posterior aspect.  Knee and LLE were noted to be red, warm, and swelled. Fluid was drawn off the left knee and sent for culture. He was admitted with septic arthritis of left knee. Culture of left knee initially appears to be gout with no bacteria noted. Order  for PT evaluation received.   Medical Lines PIV Line;Telemetry   Respiratory Status room air   Existing Precautions/Restrictions fall precautions;full code   Mutuality/Individual Preferences   Anxieties, Fears or Concerns Concerned about being able to get his knee fully straight when standing   Individualized Care Needs Pain management, Physical Therapy   Living Environment   Lives With spouse;parent(s)   Living Arrangements house   Home Accessibility ramps present at home   [place] Currently patient and his wife are staying at his father's house which is close to [place] to have access to medical help as needed. [place] in [place] which [place] hours away. Home is one story with ramp access.   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)   Pre Treatment Status   Pre Treatment Patient Status Patient supine in bed   Support Present Pre Treatment  Family present  (wife)   Communication Pre Treatment  Nurse   Communication Pre Treatment Comment cleared patient to participate in PT evaluation   Cognitive Assessment/Interventions   Behavior/Mood Observations alert;cooperative   Orientation Status oriented x 4   Attention WNL/WFL   Follows Commands WNL   Vital Signs   Pre-Treatment Heart Rate (beats/min) 70   Post-treatment Heart Rate (beats/min) 75   Pre SpO2 (%) 96   O2 Delivery Pre Treatment room air   Post SpO2 (%) 95   O2 Delivery Post Treatment room air   Pain Assessment   Pre/Posttreatment Pain Comment Pain rated 0/10 at rest, 4/10 with ROM and WB during gait.   RUE Assessment   RUE Assessment WFL- Within Functional Limits   LUE Assessment   LUE Assessment WFL- Within Functional Limits   RLE Assessment    RLE Assessment WFL- Within Functional Limits   LLE Assessment   LLE Assessment X-Exceptions   LLE ROM left hip and ankle WFL, knee (-)4 degrees to 72 degrees, limited by pain and stiffness due to fluid in joint. Soft end feel.   LLE Strength difficult to asssess fully due to pain but generally 3-/5 hip, knee 2-/5, ankle 3+/5   Trunk Assessment   Trunk Assessment WFL-Within Functional Limits   Bed Mobility Assessment/Treatment   Bed Mobility, Assistive Device bed rails;other (see comments)  (therapists's hand to pull)   Supine-Sit Independence minimum assist (75% patient effort)  (with significant patient effort, assist for LLE and to sit up trunk)   Sit to Supine, Independence minimum assist (75% patient effort);other (see comments)  (to assist LLE)   Transfer Assessment/Treatment   Sit-Stand Independence  minimum assist (75% patient effort)   Stand-Sit Independence contact guard assist   Sit-Stand-Sit, Assist Device other (see comments)   Bed-Chair Independence   (EOB and bed rail)   Gait Assessment/Treatment   Total Distance Ambulated 190   Independence  contact guard assist   Assistive Device  walker, front wheeled   Distance in Feet 190 feet   Comment 3 point step to or step through pattern, unable to progress to reciprocal due to inability to fully extend knee in stance phase and not feeling secure that it will hold him up without ise of UE support for weightbearing   Post Treatment Status   Post Treatment Patient Status Patient supine in bed   Support Present Post Treatment  Family present  (wife)   Financial trader Nurse   Communication Post Treatment Comment updated nurse on his status   Physical Therapy Clinical Impression   Assessment Patient admitted with painful left knee with swelling of LLE, erythema left knee difficulty with ambulation. Appears at this point to be gout, however, waiting for final results of culture of knee fluid.  He tolerated PT evaluation well and will benefit from  continued PT services. Plans to discharge to his father's home with wife to assist as needed and may possibly need outpatient PT services to regain strength and ROM. Will progress as tolerated.   Criteria for Skilled Therapeutic meets criteria   Impairments Found (describe specific impairments) aerobic capacity/endurance;gait, locomotion, and balance;joint integrity and mobility;muscle performance;ROM (range of motion)   Rehab Potential good   Therapy Frequency other (see comments)  (1-3x daily Monday through Saturday)   Predicted Duration of Therapy Intervention (days/wks) until discharge   Anticipated Equipment Needs at Discharge (PT) front wheeled walker;standard cane   Anticipated Discharge Disposition home with assist;home with outpatient services   Evaluation Complexity Justification   Patient History: Co-morbidity/factors that impact Plan of Care 1-2 that impact Plan of Care   Examination Components 1-2 Exam elements addressed   Presentation Stable: Uncomplicated, straight-forward, problem focused   Clinical Decision Making Low complexity   Evaluation Complexity Low complexity   Care Plan Goals   PT Rehab Goals Bed Mobility Goal;Gait Training Goal;Range of Motion Goal;Strength Goal;Transfer Training Goal   Bed Mobility Goal   Bed Mobility Goal, Date Established 04/08/22   Bed Mobility Goal, Time to Achieve by discharge   Bed Mobility Goal, Activity Type all bed mobility activities   Bed Mobility Goal, Independence Level stand-by assistance   Gait Training  Goal, Distance to Achieve   Gait Training  Goal, Date Established 04/08/22   Gait Training  Goal, Time to Achieve by discharge   Gait Training  Goal, Independence Level stand-by assistance   Gait Training  Goal, Assist Device walker, rolling   Gait Training  Goal, Distance to Achieve 500 feet   Gait Training  Goal, Additional Goal with reciprocal gait pattern and FWB on LLE with full extension at left knee during stance phase   Range of Motion Goal   Range  of Motion Goal, Date Established 04/08/22   Range of Motion Goal, Time to Achieve by discharge   Range of Motion Goal, AAROM Fxnal Goal Patient to achieve AAROM/AROM left knee from 0-90 degrees to aid mobility and functional activities.   Strength Goal   Strength Goal, Date Established 04/08/22   Strength Goal, Time to Achieve by discharge   Strength Goal, Measure to Achieve Patient will achieve strength LLE to 5/5 ankle, 4/5 hip and 4-/5  knee through available range to aid transfers and gait.   Transfer Training Goal   Transfer Training Goal, Date Established 04/08/22   Transfer Training Goal, Time to Achieve by discharge   Transfer Training Goal, Activity Type sit-to-stand/stand-to-sit;bed-to-chair/chair-to-bed;toilet   Transfer Training Goal, Independence Level stand-by assistance   Transfer Training Goal, Assist Device walker, rolling   Planned Therapy Interventions, PT Eval   Planned Therapy Interventions (PT) bed mobility training;gait training;home exercise program;joint mobilization;manual therapy techniques;patient/family education;ROM (range of motion);strengthening;stretching;transfer training               INTERVENTION MINUTES: EVALUATION 15 minutes and GAIT TRAINING 10 MINUTES    EVALUATION COMPLEXITY : CLINICAL DECISION MAKING OF LOW COMPLEXITY AS INDICATED BY PMH, PHYSICAL THERAPY ASSESSMENT OF MUSCULOSKELETAL AND NEUROLOGICAL SYSTEMS AND ACTIVITY LIMITATIONS. CLINICAL PRESENTATION IS STABLE AND UNCOMPLICATED    Therapist:     Geronimo Boot, PT  04/08/2022, 14:41

## 2022-04-08 NOTE — Care Plan (Signed)
[facility]  Care Plan Note    Situation: Admitted with septic left knee    Intervention: IV antibiotics given, PT ordered    Response: Patient is alert and oriented and on room air. Labs and VS monitored.      Problem: Adult Inpatient Plan of Care  Goal: Patient-Specific Goal (Individualized)  Outcome: Ongoing (see interventions/notes)  Flowsheets  Taken 04/08/2022 0359  Patient would like to participate in bedside shift report: Yes  Plan of Care Reviewed With:   patient   spouse  Taken 04/07/2022 2000  Patient would like to participate in bedside shift report: Yes  Individualized Care Needs: control pain, iv abx  Anxieties, Fears or Concerns: worried about weakness  Patient-Specific Goals (Include Timeframe): go home with some home health and family  Plan of Care Reviewed With:   patient   spouse       Dallas Breeding, RN

## 2022-04-09 ENCOUNTER — Other Ambulatory Visit (HOSPITAL_COMMUNITY): Payer: Self-pay

## 2022-04-09 LAB — CBC
HCT: 33.6 % — ABNORMAL LOW (ref 42.0–51.0)
HGB: 11.3 g/dL — ABNORMAL LOW (ref 13.5–18.0)
MCH: 28.7 pg (ref 27.0–32.0)
MCHC: 33.6 g/dL (ref 32.0–36.0)
MCV: 85.5 fL (ref 78.0–99.0)
MPV: 6.8 fL — ABNORMAL LOW (ref 7.4–10.4)
PLATELETS: 406 10*3/uL (ref 140–440)
RBC: 3.93 10*6/uL — ABNORMAL LOW (ref 4.20–6.00)
RDW: 14.8 % (ref 11.6–14.8)
WBC: 8.6 10*3/uL (ref 4.0–10.5)
WBCS UNCORRECTED: 8.6 10*3/uL

## 2022-04-09 LAB — COMPREHENSIVE METABOLIC PANEL, NON-FASTING
ALBUMIN/GLOBULIN RATIO: 0.8 (ref 0.8–1.4)
ALBUMIN: 3.1 g/dL — ABNORMAL LOW (ref 3.5–5.7)
ALKALINE PHOSPHATASE: 46 U/L (ref 34–104)
ALT (SGPT): 45 U/L (ref 7–52)
ANION GAP: 8 mmol/L — ABNORMAL LOW (ref 10–20)
AST (SGOT): 39 U/L (ref 13–39)
BILIRUBIN TOTAL: 0.3 mg/dL (ref 0.3–1.2)
BUN/CREA RATIO: 19 (ref 6–22)
BUN: 25 mg/dL (ref 7–25)
CALCIUM, CORRECTED: 10 mg/dL (ref 8.9–10.8)
CALCIUM: 9.1 mg/dL (ref 8.6–10.3)
CHLORIDE: 104 mmol/L (ref 98–107)
CO2 TOTAL: 24 mmol/L (ref 21–31)
CREATININE: 1.34 mg/dL — ABNORMAL HIGH (ref 0.60–1.30)
ESTIMATED GFR: 64 mL/min/{1.73_m2} (ref >59)
GLOBULIN: 3.7 (ref 2.9–5.4)
GLUCOSE: 95 mg/dL (ref 74–109)
OSMOLALITY, CALCULATED: 276 mOsm/kg (ref 270–290)
POTASSIUM: 3.6 mmol/L (ref 3.5–5.1)
PROTEIN TOTAL: 6.8 g/dL (ref 6.4–8.9)
SODIUM: 136 mmol/L (ref 136–145)

## 2022-04-09 LAB — MAGNESIUM: MAGNESIUM: 2.1 mg/dL (ref 1.9–2.7)

## 2022-04-09 NOTE — Care Plan (Signed)
[facility]  Care Plan Note    Situation: Patient admitted with effusion of bursa of left knee.     Intervention: Patient receiving IV antibiotics. Pain monitored and medicated as needed. Assist with repositioning as needed.     Response: Labs and VS monitored. Patient cooperative and calm through shift.       Problem: Adult Inpatient Plan of Care  Goal: Patient-Specific Goal (Individualized)  Outcome: Ongoing (see interventions/notes)  Flowsheets  Taken 04/09/2022 0344  Patient would like to participate in bedside shift report: Yes  Plan of Care Reviewed With:   patient   spouse  Taken 04/08/2022 2000  Patient would like to participate in bedside shift report: Yes  Individualized Care Needs: pain control, IV abx  Anxieties, Fears or Concerns: worried about weakness  Patient-Specific Goals (Include Timeframe): d/c home with family and PT  Plan of Care Reviewed With:   patient   spouse     Dallas Breeding, RN

## 2022-04-09 NOTE — PT Treatment (Signed)
[facility]  [address]  [place], [address]  [phone]  (Fax) [phone]  Rehabilitation Department  Physical Therapy Daily Inpatient Note    Date: 04/09/2022  Patient's Name: Arlon Bleier  Date of Birth: 1971-03-21  Height: Height: 182.9 cm (6' 0.01")  Weight: Weight: 106 kg (234 lb 3 oz)      Plan: Will continue under current POC.         Subjective/Objective/Assessment:  Flowsheet   04/09/22 0835   Rehab Session   Document Type therapy progress note (daily note)   Total PT Minutes: 25   Patient Effort good   General Information   Respiratory Status room air   Existing Precautions/Restrictions fall precautions   Cognitive Assessment/Interventions   Behavior/Mood Observations behavior appropriate to situation, WNL/WFL   Orientation Status oriented x 4   Attention WNL/WFL   Vital Signs   Vitals Comment vitals stable   Pain Assessment   Pain Intervention  PRN Medication   Pretreatment Pain Rating 5/10   Posttreatment Pain Rating 5/10   Pre/Posttreatment Pain Comment pain in the left knee   Bed Mobility Assessment/Treatment   Bed Mobility, Assistive Device bed rails   Supine-Sit Independence stand-by assistance   Transfer Assessment/Treatment   Sit-Stand Independence contact guard assist   Stand-Sit Independence contact guard assist   Gait Assessment/Treatment   Total Distance Ambulated 250   Independence  stand-by assistance   Assistive Device  walker, front wheeled   Balance Skill Training   Sitting Balance: Static good balance   Sitting, Dynamic (Balance) good balance   Sit-to-Stand Balance fair + balance   Standing Balance: Static fair + balance   Post Treatment Status   Post Treatment Patient Status Patient sitting in bedside chair or w/c   Support Present Post Treatment  Family present   Physical Therapy Clinical Impression   Assessment patient transfered supine to sit with bed rails, standing with cga, gaited with rw 328ft, no lob noted, couple short standing  rests, left sitting eob with needs in reach, bed alarmed                 Intervention minutes: GAIT TRAINING 25 MINUTES    THERAPIST  Lane Hacker, PTA  04/09/2022, 11:58

## 2022-04-09 NOTE — Nurses Notes (Signed)
Discharge instructions discussed with patient and wife, education provided. Patient verbalized understanding. Patient transported off floor via wheelchair by this nurse. No s/s of distress noted.

## 2022-04-09 NOTE — Nurses Notes (Signed)
Patient Metoprolol Tartrate called in to [facility] pharmacy in [place]. Patient wife had stated that his pharmacy is two hours away and he is currently living in [place].

## 2022-04-09 NOTE — Discharge Summary (Signed)
[facility]  DISCHARGE SUMMARY    PATIENT NAME:  Satoshi, Kalas  MRN:  [id number]  DOB:  1971/09/28    ENCOUNTER DATE:  04/05/2022  INPATIENT ADMISSION DATE: 04/06/2022  DISCHARGE DATE:  04/09/2022    ATTENDING PHYSICIAN: Harless Litten, MD  SERVICE: PRN HOSPITALIST 2  PRIMARY CARE PHYSICIAN: Paticia Stack., PA       No lay caregiver identified.      PRIMARY DISCHARGE DIAGNOSIS: Effusion of bursa of left knee  Active Hospital Problems    Diagnosis Date Noted   . Principal Problem: Effusion of bursa of left knee [M25.462] 04/08/2022   . Gout flare [M10.9] 04/08/2022   . Septic arthritis of knee, left (CMS [facility]) [M00.9] 04/06/2022      Resolved Hospital Problems   No resolved problems to display.     There are no active non-hospital problems to display for this patient.       Physical Exam:  General:  Patient in NAD, resting in bed, no visitors present  Head:  Normocephalic, atraumatic  Eyes:  PERRL, anicteric sclera  ENT:  Oral mucosa moist, no nasal discharge   Neck:  Soft, supple, trachea midline  Heart:  RRR, S1 and S2 normal  Lungs:  Unlabored respirations.  Lungs are clear to auscultation bilaterally, with no wheezes, no rales, no conversational dyspnea  Abdomen:  Soft, active bowel sounds, non-tender to palpation, non-distended  Extremities:  Pulses equal bilaterally.  Capillary refill less than 3 seconds. Left leg more swollen than right leg.  Palpable fluid noted to left knee.  ROM limited left leg.    Skin:  Warm and dry, not diaphoretic.  No ecchymosis noted.   Neuro:  A&O x 3.  No focal deficits.  Speech intact  Psych:  Cooperative, not agitated         Current Discharge Medication List      START taking these medications.      Details   metoprolol tartrate 25 mg Tablet  Commonly known as: LOPRESSOR   25 mg, Oral, 2 TIMES DAILY  Refills: 0        CONTINUE these medications - NO CHANGES were made during your visit.      Details   allopurinoL 100 mg Tablet  Commonly known as: ZYLOPRIM   100 mg,  Oral, DAILY  Refills: 0     amLODIPine 10 mg Tablet  Commonly known as: NORVASC   10 mg, Oral, DAILY  Refills: 0     atorvastatin 80 mg Tablet  Commonly known as: LIPITOR   80 mg, Oral, EVERY EVENING  Refills: 0     HYDROcodone-acetaminophen 5-325 mg Tablet  Commonly known as: NORCO   1 Tablet, Oral, EVERY 6 HOURS PRN  Refills: 0        STOP taking these medications.    acetaminophen-codeine 300-30 mg Tablet  Commonly known as: TYLENOL #3     lisinopriL 10 mg Tablet  Commonly known as: PRINIVIL     Methylprednisolone 4 mg Tablets, Dose Pack  Commonly known as: MEDROL DOSEPACK          Discharge med list refreshed?  YES     Allergies   Allergen Reactions   . Motrin [Ibuprofen] GI Bleed   . Tamiflu [Oseltamivir]  Other Adverse Reaction (Add comment)     [facility]):   Orders Placed This Encounter   Procedures   . BEDSIDE  ARTHROCENTESIS  REASON FOR HOSPITALIZATION AND HOSPITAL COURSE   Taijon Vink is a 51 year old male who presented to the ER on 04/05/22 with complaints of Left leg pain (behind knee).  He reported that the leg was painful with walking or movement, swollen, but not red or warm to touch.  He stated that the pain radiated to left calf and reports tightness/swelling to knee and lower leg.  He reported that the symptoms started 2 days prior to coming to the ER.  The patient had not been on anticoagulation due to having recent hemorrhagic strokes.  The patient does have gout and is on allopurinol at home.  Labs upon arrival to ER were as follows:  WBC 11.9, Hgb 12.2, sodium 134, BUN 19, creatinine 1.40, lactic acid 1.1, uric acid 5.  Left knee x-ray showed large knee effusion.  The patient was suspected to have septic joint and aspiration of left knee was completed.  Cultures sent to lab and were negative for any bacterial growth.  Crystals present in aspirated fluid.  He was treated with vancomycin and rocephin IV empirically, but this was discontinued once cultures determined to  be negative.  Physical therapy ordered for evaluation of gait and the patient was able to walk about 190 feet with walker.  The patient states that he feels better and would like to go home.   The patient is agreeable to home health services as well.  The patient will be discharged to home today with home health for skilled observation/physical therapy.  He will need to follow up with PCP in 1-2 weeks.  Rheumatology evaluation after resolution of symptoms should be considered in the future.  The patient is to follow up as scheduled at [facility] for planning of carotid endarterectomy.  Home medications resumed as appropriate.  The hospitalist examined the patient, reviewed material, and agreed with discharge at this time.  This discharge took greater than 30 minutes.      TRANSITION/POST DISCHARGE CARE/PENDING TESTS/REFERRALS: Patient to follow up with PCP In 1-2 weeks.  Would recommend that the patient be referred as outpatient to rheumatology.  Follow up with [facility] as already scheduled.      CONDITION ON DISCHARGE:  A. Ambulation: Ambulation with assistive device  B. Self-care Ability: With partial assistance  C. Cognitive Status Alert and Oriented x 3  D. Code status at discharge:   Full Code      LINES/DRAINS/WOUNDS AT DISCHARGE:   Patient Lines/Drains/Airways Status     Active Line / Dialysis Catheter / Dialysis Graft / Drain / Airway / Wound     Name Placement date Placement time Site Days    Peripheral IV Left;Posterior Dorsal Metacarpals  (top of hand) 04/09/22  0224  -- less than 1                DISCHARGE DISPOSITION:  Home discharge and Home Health  DISCHARGE INSTRUCTIONS:    No discharge procedures on file.       Stanford Breed, FNP-BC    Copies sent to Care Team       Relationship Specialty Notifications Start End    Paticia Stack., PA PCP - General PHYSICIAN ASSISTANT  04/05/22     Phone: [phone] Fax: [phone]         [address] [place] [address]          Referring providers  can utilize [email or web address] to access their referred [facility] patient's information.

## 2022-04-10 LAB — STERILE SITE CULTURE AND GRAM STAIN, AEROBIC
FLC: NO GROWTH
GRAM STAIN: NONE SEEN

## 2022-04-11 LAB — ADULT ROUTINE BLOOD CULTURE, SET OF 2 BOTTLES (BACTERIA AND YEAST)
BLOOD CULTURE, ROUTINE: NO GROWTH
BLOOD CULTURE, ROUTINE: NO GROWTH

## 2022-04-21 ENCOUNTER — Other Ambulatory Visit: Payer: Self-pay

## 2022-04-29 HISTORY — PX: HX CAROTID ENDARTERECTOMY: SHX38

## 2022-05-06 DIAGNOSIS — R6889 Other general symptoms and signs: Secondary | ICD-10-CM | POA: Insufficient documentation

## 2022-06-20 DIAGNOSIS — R239 Unspecified skin changes: Secondary | ICD-10-CM | POA: Insufficient documentation

## 2023-08-16 DIAGNOSIS — M79661 Pain in right lower leg: Secondary | ICD-10-CM | POA: Insufficient documentation

## 2023-08-16 DIAGNOSIS — M79605 Pain in left leg: Secondary | ICD-10-CM | POA: Insufficient documentation

## 2023-08-17 DIAGNOSIS — K4090 Unilateral inguinal hernia, without obstruction or gangrene, not specified as recurrent: Secondary | ICD-10-CM | POA: Insufficient documentation

## 2023-09-13 ENCOUNTER — Telehealth (INDEPENDENT_AMBULATORY_CARE_PROVIDER_SITE_OTHER): Payer: Self-pay | Admitting: PHYSICIAN ASSISTANT

## 2023-09-13 NOTE — Telephone Encounter (Signed)
Patient requesting muscle relaxant or treatment prior to MRI

## 2023-09-13 NOTE — Telephone Encounter (Signed)
Pts wife came in this AM and advised that Pt still has muscle spasm and was wondering if there is anything you can give Pt for Pt to have MRI.

## 2023-09-14 MED ORDER — TIZANIDINE 4 MG TABLET
4.0000 mg | ORAL_TABLET | Freq: Three times a day (TID) | ORAL | 1 refills | Status: DC
Start: 2023-09-14 — End: 2024-01-20

## 2023-09-15 IMAGING — MR MRI LUMBAR SPINE WITHOUT CONTRAST
5 of 6 series · 33 of 48 positions shown · IV contrast (gadolinium)
Comparison: 03/13/2021 MRI lumbar spine without contrast.

﻿EXAM:  [id number]   MRI LUMBAR SPINE WITHOUT CONTRAST
INDICATION: Radiculopathy.
TECHNIQUE: Multiplanar multisequential MRI of the lumbar spine was performed without gadolinium contrast.

[Series 6: T2 · sagittal · 4.5mm · 0.94mm/px · 7 of 14 slices shown (1 of 3)]
[im 1/14]
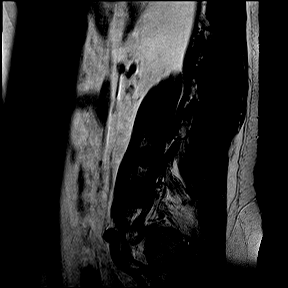
[im 3/14]
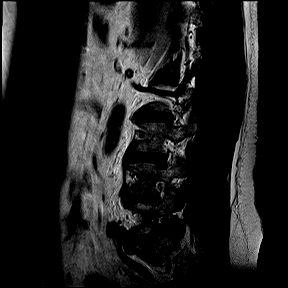
[im 5/14]
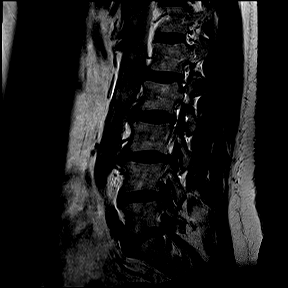
[im 7/14]
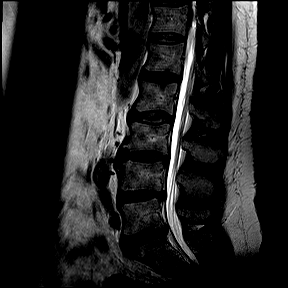
[im 9/14]
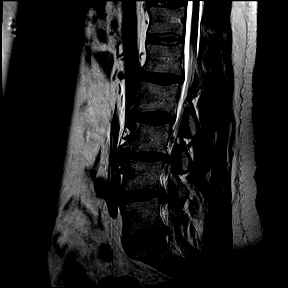
[im 11/14]
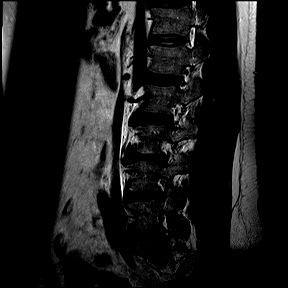
[im 14/14]
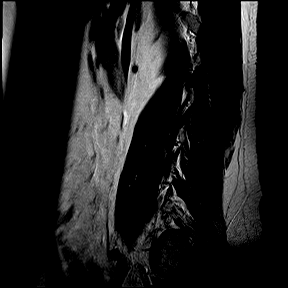

[Series 7: T1 · sagittal · 4.5mm · 0.94mm/px · 7 of 14 slices shown (1 of 2)]
[im 1/14]
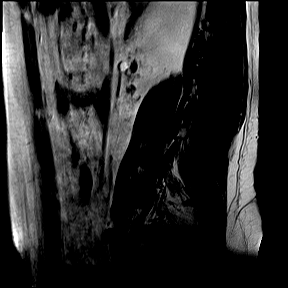
[im 3/14]
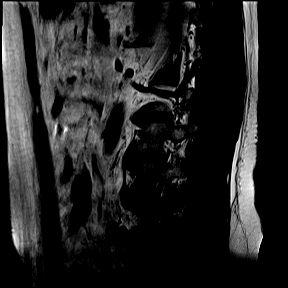
[im 5/14]
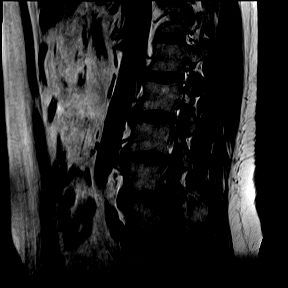
[im 7/14]
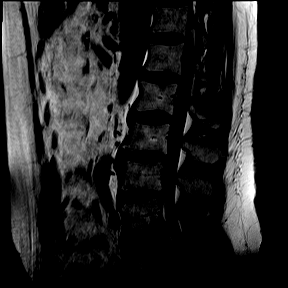
[im 9/14]
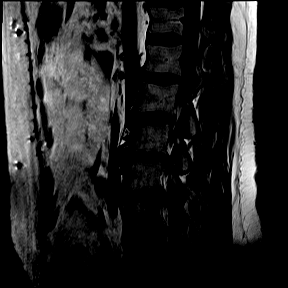
[im 11/14]
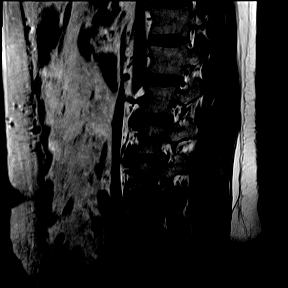
[im 14/14]
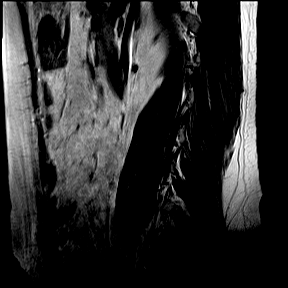

[Series 9: T2 · coronal · 5.0mm · 0.82mm/px · 8 of 18 slices shown (2 of 3)]
[im 1/18]
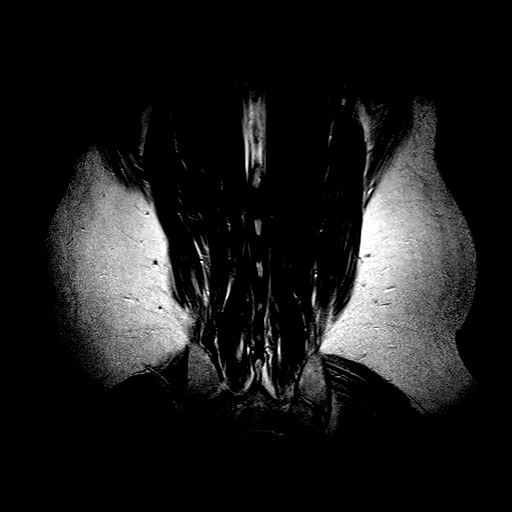
[im 3/18]
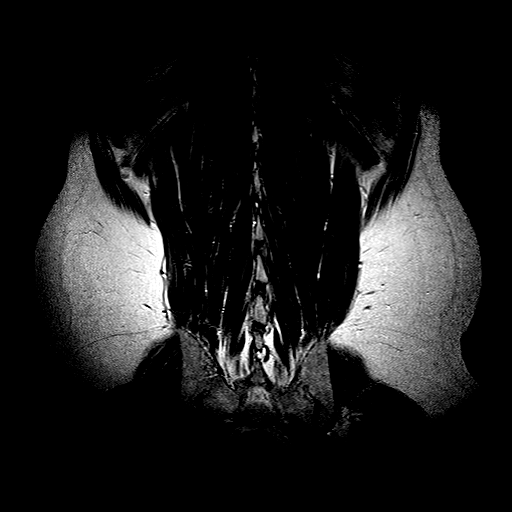
[im 5/18]
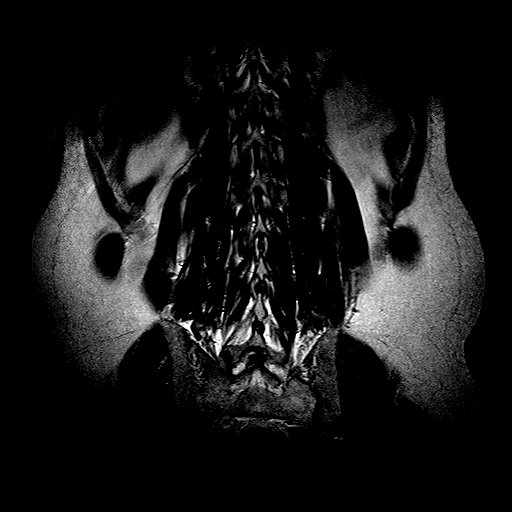
[im 8/18]
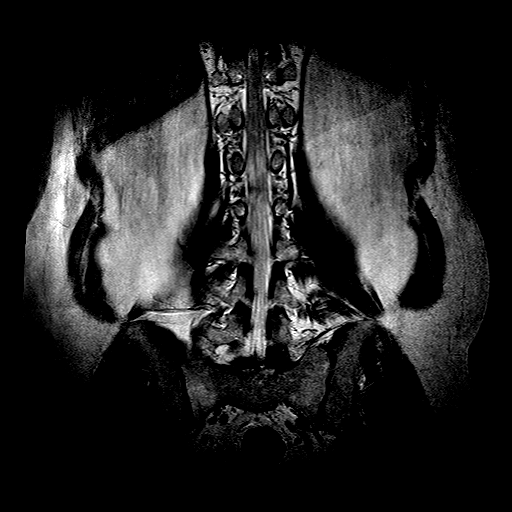
[im 10/18]
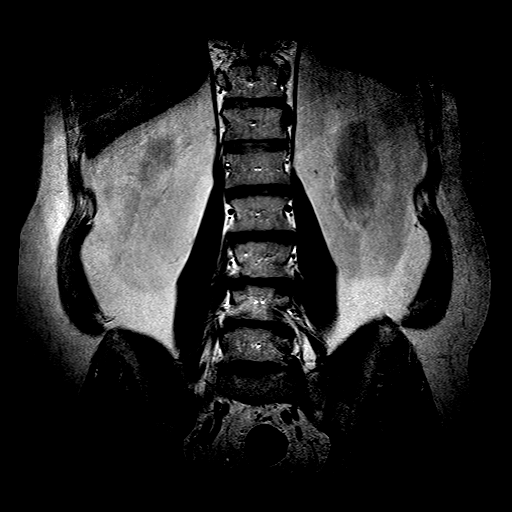
[im 13/18]
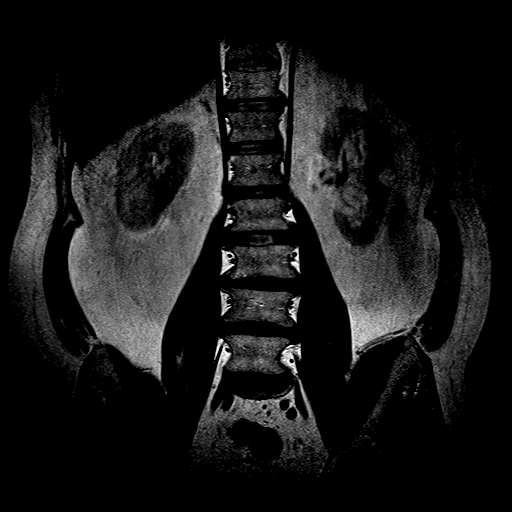
[im 15/18]
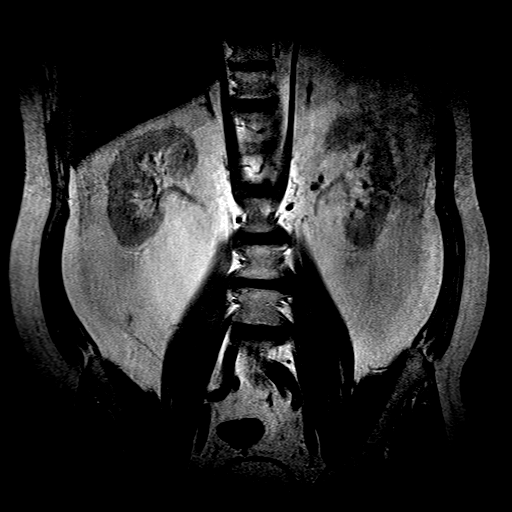
[im 18/18]
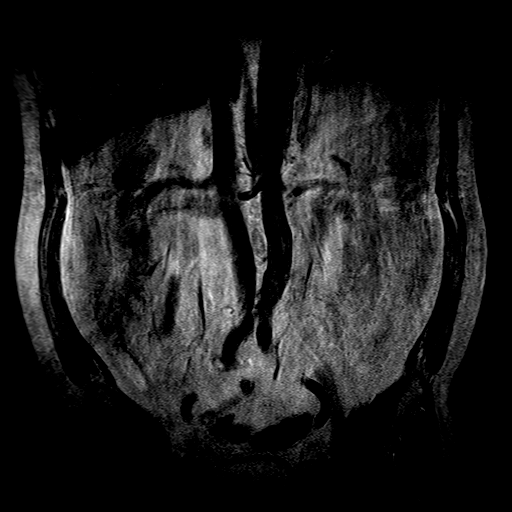

[Series 10: T2 · axial · 4.0mm · 0.52mm/px · z∈[-179,+21]mm · 8 of 23 slices shown (3 of 3)]
[im 1/23]
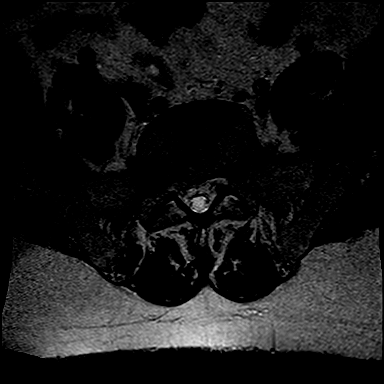
[im 3/23]
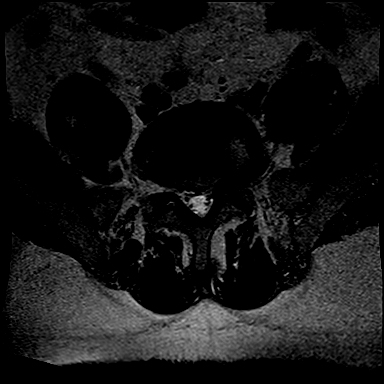
[im 8/23]
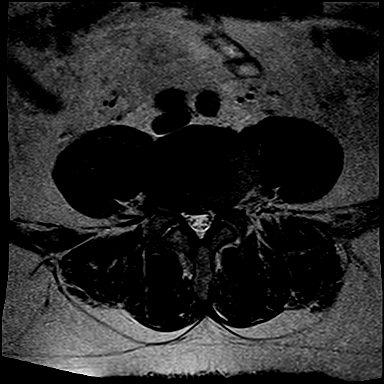
[im 10/23]
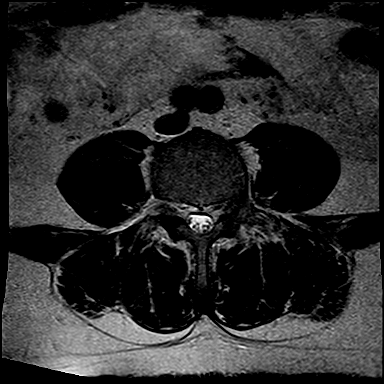
[im 13/23]
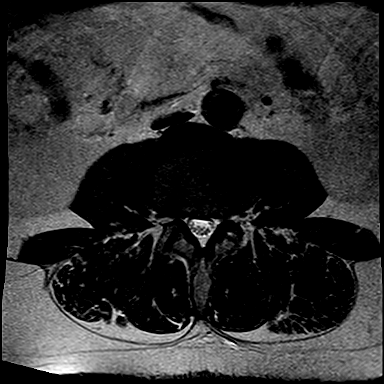
[im 15/23]
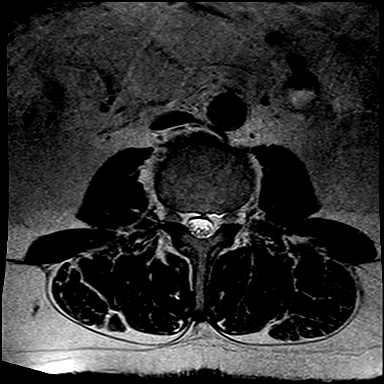
[im 20/23]
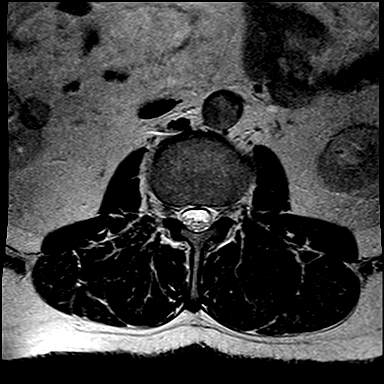
[im 23/23]
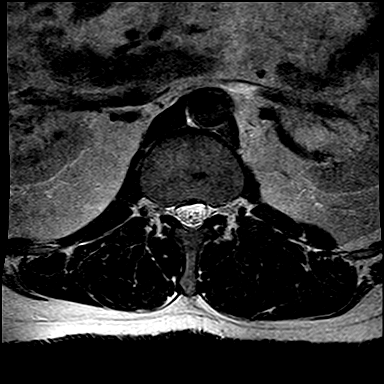

[Series 11: T1 · axial · 4.0mm · 0.52mm/px · z∈[-179,-109]mm · 3 of 23 slices shown (2 of 2)]
[im 1/23]
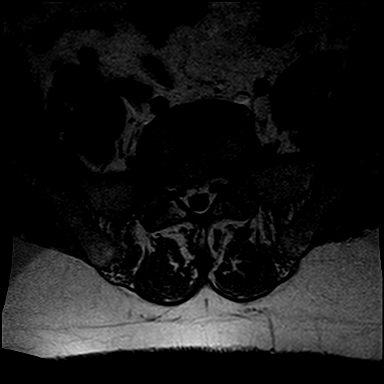
[im 3/23]
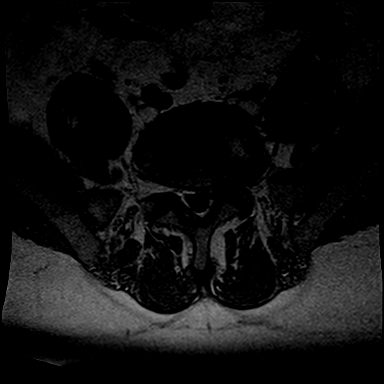
[im 8/23]
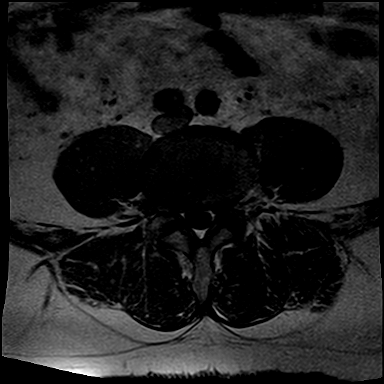

[33 of 48 positions shown; findings below may reference images not displayed]

FINDINGS: There are mild to moderate degenerative changes at multiple levels including L2-3, L3-4, L4-5, and L5-S1.  There is no fracture, malalignment, significant marrow signal alteration, or abnormality of the conus.  

At L1-2 there is minimal right paracentral disc bulging.  

L2-3 appears unremarkable.  

At L3-4 there is a small left paracentral disc herniation with slight compression of the thecal sac and possible compression of the left L4 nerve root.  

At L4-5 there is a small right paracentral disc herniation effacing the epidural fat and possibly compressing the right L5 nerve root.  

L5-S1 is unremarkable.
IMPRESSION: 1. L3-4 and L4-5 disc herniations.  

2. Additional findings as above.

## 2023-09-17 ENCOUNTER — Other Ambulatory Visit (INDEPENDENT_AMBULATORY_CARE_PROVIDER_SITE_OTHER): Payer: Self-pay | Admitting: PHYSICIAN ASSISTANT

## 2023-09-20 ENCOUNTER — Other Ambulatory Visit (INDEPENDENT_AMBULATORY_CARE_PROVIDER_SITE_OTHER): Payer: Self-pay | Admitting: PHYSICIAN ASSISTANT

## 2023-09-20 ENCOUNTER — Encounter (INDEPENDENT_AMBULATORY_CARE_PROVIDER_SITE_OTHER): Payer: Self-pay | Admitting: PHYSICIAN ASSISTANT

## 2023-09-20 MED ORDER — METHOCARBAMOL 500 MG TABLET
500.0000 mg | ORAL_TABLET | Freq: Two times a day (BID) | ORAL | 0 refills | Status: DC
Start: 2023-09-20 — End: 2024-01-20

## 2023-09-20 MED ORDER — NAPROXEN SODIUM 550 MG TABLET
550.0000 mg | ORAL_TABLET | Freq: Two times a day (BID) | ORAL | 0 refills | Status: DC
Start: 2023-09-20 — End: 2024-01-20

## 2023-09-20 NOTE — Telephone Encounter (Signed)
Pt called and stated he needed a refill on methocarbamol and naproxen please

## 2023-09-29 ENCOUNTER — Encounter (INDEPENDENT_AMBULATORY_CARE_PROVIDER_SITE_OTHER): Payer: Self-pay | Admitting: PHYSICIAN ASSISTANT

## 2023-09-29 DIAGNOSIS — E039 Hypothyroidism, unspecified: Secondary | ICD-10-CM

## 2023-09-29 DIAGNOSIS — I2510 Atherosclerotic heart disease of native coronary artery without angina pectoris: Secondary | ICD-10-CM

## 2023-09-29 DIAGNOSIS — I6529 Occlusion and stenosis of unspecified carotid artery: Secondary | ICD-10-CM

## 2023-09-29 DIAGNOSIS — I6521 Occlusion and stenosis of right carotid artery: Secondary | ICD-10-CM

## 2023-09-29 DIAGNOSIS — M461 Sacroiliitis, not elsewhere classified: Secondary | ICD-10-CM

## 2023-09-29 DIAGNOSIS — I639 Cerebral infarction, unspecified: Secondary | ICD-10-CM

## 2023-09-29 DIAGNOSIS — M5416 Radiculopathy, lumbar region: Secondary | ICD-10-CM

## 2023-09-29 DIAGNOSIS — E785 Hyperlipidemia, unspecified: Secondary | ICD-10-CM

## 2023-09-29 DIAGNOSIS — E790 Hyperuricemia without signs of inflammatory arthritis and tophaceous disease: Secondary | ICD-10-CM

## 2023-09-29 DIAGNOSIS — R590 Localized enlarged lymph nodes: Secondary | ICD-10-CM

## 2023-09-29 DIAGNOSIS — I10 Essential (primary) hypertension: Secondary | ICD-10-CM

## 2023-09-29 DIAGNOSIS — F419 Anxiety disorder, unspecified: Secondary | ICD-10-CM

## 2023-09-29 HISTORY — DX: Atherosclerotic heart disease of native coronary artery without angina pectoris: I25.10

## 2023-09-29 HISTORY — DX: Hypothyroidism, unspecified: E03.9

## 2023-09-29 HISTORY — DX: Essential (primary) hypertension: I10

## 2023-09-29 HISTORY — DX: Occlusion and stenosis of right carotid artery: I65.21

## 2023-09-29 HISTORY — DX: Sacroiliitis, not elsewhere classified: M46.1

## 2023-09-29 HISTORY — DX: Radiculopathy, lumbar region: M54.16

## 2023-09-29 HISTORY — DX: Anxiety disorder, unspecified: F41.9

## 2023-09-29 HISTORY — DX: Occlusion and stenosis of unspecified carotid artery: I65.29

## 2023-09-29 HISTORY — DX: Hyperlipidemia, unspecified: E78.5

## 2023-09-29 HISTORY — DX: Localized enlarged lymph nodes: R59.0

## 2023-09-29 HISTORY — DX: Hyperuricemia without signs of inflammatory arthritis and tophaceous disease: E79.0

## 2023-09-29 HISTORY — DX: Cerebral infarction, unspecified: I63.9

## 2023-09-30 ENCOUNTER — Ambulatory Visit (INDEPENDENT_AMBULATORY_CARE_PROVIDER_SITE_OTHER): Payer: 59 | Admitting: PHYSICIAN ASSISTANT

## 2023-09-30 ENCOUNTER — Encounter (INDEPENDENT_AMBULATORY_CARE_PROVIDER_SITE_OTHER): Payer: Self-pay | Admitting: PHYSICIAN ASSISTANT

## 2023-09-30 ENCOUNTER — Other Ambulatory Visit: Payer: Self-pay

## 2023-09-30 VITALS — BP 158/92 | HR 92 | Temp 97.9°F | Resp 16 | Ht 72.0 in | Wt 221.0 lb

## 2023-09-30 DIAGNOSIS — I639 Cerebral infarction, unspecified: Secondary | ICD-10-CM

## 2023-09-30 DIAGNOSIS — Z8673 Personal history of transient ischemic attack (TIA), and cerebral infarction without residual deficits: Secondary | ICD-10-CM

## 2023-09-30 DIAGNOSIS — I10 Essential (primary) hypertension: Secondary | ICD-10-CM

## 2023-09-30 DIAGNOSIS — M5416 Radiculopathy, lumbar region: Secondary | ICD-10-CM

## 2023-09-30 NOTE — Progress Notes (Addendum)
FAMILY MEDICINE, [facility]  [address] [place] [address]      Name: Olga Norsworthy MRN:  [id number]   Date: 09/30/2023 DOB:  March 09, 1971 (52 y.o.)   PCP: Brynda Greathouse, PA-C    Chief Complaint:  Blood Pressure Check (Patient in for follow-up of blood pressure) and Hypertension       HPI:  Troy Rose is a 52 y.o. White male  patient in today for follow-up, past medical history significant for CVA carotid stenosis coronary atherosclerosis history of gout hypertension and hyperlipidemia  Medications reconciled and current.     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    Inguinal lymphadenopathy 09/29/2023    Lumbar radiculopathy 09/29/2023    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      Past Surgical History:   Procedure Laterality Date    COLONOSCOPY  2016    ESOPHAGOGASTRODUODENOSCOPY      for gi bleed    HX ANGIOPLASTY      HX CAROTID ENDARTERECTOMY  04/29/2022      Social History     Tobacco Use    Smoking status: Never     Passive exposure: Never    Smokeless tobacco: Never   Vaping Use    Vaping status: Never Used   Substance Use Topics    Alcohol use: Yes     Comment: occasional    Drug use: Never       Family Medical History:       Problem Relation (Age of Onset)    Cancer Paternal Grandmother    Heart Attack Maternal Grandmother    Heart Disease Brother, Paternal Grandmother    Hypertension (High Blood Pressure) Brother    Stroke Mother           Outpatient Medications Marked as Taking for the 09/30/23 encounter (Office Visit) with Brynda Greathouse, PA-C   Medication  Sig    allopurinoL (ZYLOPRIM) 100 mg Oral Tablet Take 1 Tablet (100 mg total) by mouth Twice daily    aspirin (ECOTRIN) 81 mg Oral Tablet, Delayed Release (E.C.) Take 1 Tablet (81 mg total) by mouth Once a day    levothyroxine (SYNTHROID) 50 mcg Oral Tablet Take 1 Tablet (50 mcg total) by mouth Every morning    lisinopriL (PRINIVIL) 5 mg Oral Tablet Take 1 Tablet (5 mg total) by mouth Once a day    LORazepam (ATIVAN) 0.5 mg Oral Tablet Take 1 Tablet (0.5 mg total) by mouth Three times a day as needed for Anxiety    methocarbamoL (ROBAXIN) 500 mg Oral Tablet Take 1 Tablet (500 mg total) by mouth Twice daily (Patient not taking: Reported on 11/04/2023)    naproxen sodium (ANAPROX) 550 mg Oral Tablet Take 1 Tablet (550 mg total) by mouth Twice daily with food (Patient not taking: Reported on 11/04/2023)    tiZANidine (ZANAFLEX) 4 mg Oral Tablet Take 1 Tablet (4 mg total) by mouth Three times a day (Patient not taking: Reported on 11/04/2023)    [  EXPIRED] traMADoL (ULTRAM) 50 mg Oral Tablet Take 1 Tablet (50 mg total) by mouth Every 6 hours as needed for Pain for up to 75 days (Patient not taking: Reported on 11/04/2023)    [DISCONTINUED] atorvastatin (LIPITOR) 80 mg Oral Tablet Take 1 Tablet (80 mg total) by mouth Every evening      Allergies   Allergen Reactions    Hydrochlorothiazide Mental Status Effect    Motrin [Ibuprofen] GI Bleed    Tamiflu [Oseltamivir]  Other Adverse Reaction (Add comment)     Jittery        Review of Systems:  Positive findings addressed in HPI      PHYSICAL EXAM:   The patient appears to be in no acute distress.  Vitals:   Vitals:    09/30/23 1519   BP: (!) 158/92   Pulse: 92   Resp: 16   Temp: 36.6 C (97.9 F)   TempSrc: Temporal   SpO2: 98%   Weight: 100 kg (221 lb)   Height: 1.829 m (6')   BMI: 30.04      Physical Exam   HEENT:   Head Normocephalic. No masses, lesions, tenderness or abnormalities  Eyes: PERRLA, EOMI, conjunctiva wnl.    Ears: Bilateral TMs intact and clear.  Nose: Bilateral  nares patent  Throat: Pharynx clear. No lymphadenopathy or thyromegaly.  RESPIRATORY: CTA, no resp distress, equal BS  HEART: Regular rate and rhythm, no murmur, no edema, pedal pulses 2+  GI:  BS present in 4 quadrants.  No tenderness, masses, organomegaly.  Abdomen soft.  EXTREMITIES: no edema, erythema or tenderness  NEURO: AAOx3, CN's intact, DTR's 2/4 throughout, Romberg not tested    Data Review:  Pertinent laboratory data and imaging studies reviewed.        Assessment/Plan:     ICD-10-CM    1. Lumbar radiculopathy  M54.16       2. Cerebrovascular accident (CVA), unspecified mechanism (CMS HCC)  I63.9       3. Essential hypertension  I10          Assessment & Plan  Lumbar radiculopathy  Patient is encouraged to use Mackenzie's exercises for the back for his low back pain and lumbar radiculopathy.       Cerebrovascular accident (CVA), unspecified mechanism (CMS HCC)  History of CVA in this patient which makes it a little more difficult to assess his lumbar symptoms versus neuritis post CVA.       Essential hypertension  The patient's blood pressure is better controlled than it has been in the past however he still has fluctuations I have encouraged him to reduce his sodium in his diet and be compliant with his medications and follow this so we can see the trends.            Coding       Depression screening is negative. PHQ 2 Total: 0              Return in about 4 weeks (around 10/28/2023).      Brynda Greathouse, PA-C

## 2023-10-01 ENCOUNTER — Encounter (INDEPENDENT_AMBULATORY_CARE_PROVIDER_SITE_OTHER): Payer: Self-pay | Admitting: PHYSICIAN ASSISTANT

## 2023-10-14 NOTE — Assessment & Plan Note (Addendum)
Patient is encouraged to use Mackenzie's exercises for the back for his low back pain and lumbar radiculopathy.

## 2023-10-14 NOTE — Assessment & Plan Note (Addendum)
History of CVA in this patient which makes it a little more difficult to assess his lumbar symptoms versus neuritis post CVA.

## 2023-10-14 NOTE — Assessment & Plan Note (Addendum)
The patient's blood pressure is better controlled than it has been in the past however he still has fluctuations I have encouraged him to reduce his sodium in his diet and be compliant with his medications and follow this so we can see the trends.

## 2023-11-04 ENCOUNTER — Ambulatory Visit (INDEPENDENT_AMBULATORY_CARE_PROVIDER_SITE_OTHER): Payer: 59 | Admitting: PHYSICIAN ASSISTANT

## 2023-11-04 ENCOUNTER — Encounter (INDEPENDENT_AMBULATORY_CARE_PROVIDER_SITE_OTHER): Payer: Self-pay | Admitting: PHYSICIAN ASSISTANT

## 2023-11-04 ENCOUNTER — Other Ambulatory Visit: Payer: Self-pay

## 2023-11-04 VITALS — BP 179/112 | HR 79 | Temp 97.9°F | Resp 16 | Ht 72.0 in | Wt 227.3 lb

## 2023-11-04 DIAGNOSIS — I10 Essential (primary) hypertension: Secondary | ICD-10-CM

## 2023-11-04 DIAGNOSIS — M5416 Radiculopathy, lumbar region: Secondary | ICD-10-CM

## 2023-11-04 DIAGNOSIS — I639 Cerebral infarction, unspecified: Secondary | ICD-10-CM

## 2023-11-04 DIAGNOSIS — Z8673 Personal history of transient ischemic attack (TIA), and cerebral infarction without residual deficits: Secondary | ICD-10-CM

## 2023-11-04 DIAGNOSIS — I779 Disorder of arteries and arterioles, unspecified: Secondary | ICD-10-CM

## 2023-11-04 NOTE — Progress Notes (Signed)
FAMILY MEDICINE, [facility]  [address] [place] [address]      Name: Troy Rose MRN:  [id number]   Date: 11/04/2023 DOB:  1971-03-27 (52 y.o.)   PCP: Brynda Greathouse, PA-C    Chief Complaint:  Follow Up (Wants to discuss getting a work release to go back to work. )       HPI:  Troy Rose is a 52 y.o. White male  patient is in today states he is feeling better and would like a release to go back to work states he is able to move without pain is having no difficulty with his gait has had no problems with weakness 1 side or the other.  Medications reconciled and current.     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    Inguinal lymphadenopathy 09/29/2023    Lumbar radiculopathy 09/29/2023    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      Past Surgical History:   Procedure Laterality Date    COLONOSCOPY  2016    ESOPHAGOGASTRODUODENOSCOPY      for gi bleed    HX ANGIOPLASTY      HX CAROTID ENDARTERECTOMY  04/29/2022      Social History     Tobacco Use    Smoking status: Never     Passive exposure: Never    Smokeless tobacco: Never   Vaping Use    Vaping status: Never Used   Substance Use Topics    Alcohol use: Yes     Comment: occasional    Drug use: Never       Family Medical History:       Problem Relation (Age of Onset)    Cancer Paternal Grandmother    Heart Attack Maternal Grandmother    Heart Disease Brother, Paternal Grandmother    Hypertension (High Blood Pressure) Brother    Stroke Mother           Outpatient Medications Marked as Taking for the 11/04/23 encounter  (Office Visit) with Brynda Greathouse, PA-C   Medication Sig    allopurinoL (ZYLOPRIM) 100 mg Oral Tablet Take 1 Tablet (100 mg total) by mouth Twice daily    aspirin (ECOTRIN) 81 mg Oral Tablet, Delayed Release (E.C.) Take 1 Tablet (81 mg total) by mouth Once a day    levothyroxine (SYNTHROID) 50 mcg Oral Tablet Take 1 Tablet (50 mcg total) by mouth Every morning    lisinopriL (PRINIVIL) 5 mg Oral Tablet Take 1 Tablet (5 mg total) by mouth Once a day    [DISCONTINUED] atorvastatin (LIPITOR) 80 mg Oral Tablet Take 1 Tablet (80 mg total) by mouth Every evening      Allergies   Allergen Reactions    Hydrochlorothiazide Mental Status Effect    Motrin [Ibuprofen] GI Bleed    Tamiflu [Oseltamivir]  Other Adverse Reaction (Add comment)     Jittery        Review of Systems:  Positive findings addressed in HPI  PHYSICAL EXAM:   The patient appears to be in no acute distress.  Vitals:   Vitals:    11/04/23 0919   BP: (!) 179/112   Pulse: 79   Resp: 16   Temp: 36.6 C (97.9 F)   TempSrc: Temporal   SpO2: 95%   Weight: 103 kg (227 lb 4.8 oz)   Height: 1.829 m (6')   BMI: 30.83      Physical Exam   Vital signs reviewed and the patient's blood pressure is very elevated today.  He reports he has not taken his medication as well as is a little anxious.  HEENT:   Head Normocephalic. No masses, lesions, tenderness or abnormalities  Eyes: PERRLA, EOMI, conjunctiva wnl.    Ears: Bilateral TMs intact and clear.  Nose: Bilateral nares patent  Throat: Pharynx clear. No lymphadenopathy or thyromegaly.  RESPIRATORY: CTA, no resp distress, equal BS  HEART: Regular rate and rhythm, no murmur, no edema, pedal pulses 2+  GI:  BS present in 4 quadrants.  No tenderness, masses, organomegaly.  Abdomen soft.  EXTREMITIES: no edema, erythema or tenderness  NEURO: AAOx3, CN's intact, DTR's 2/4 throughout, Romberg not tested  Psych:  Positive for anxiety positive for agitation.  Denies SI or HI    Data Review:  Pertinent laboratory data and imaging  studies reviewed.        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 Essential hypertension  I10       3. Cerebrovascular accident (CVA), unspecified mechanism (CMS HCC)  I63.9       4. Lumbar radiculopathy  M54.16          Assessment & Plan  Aorta disorder (CMS [facility])  Patient had previous irregular examination of the aorta we will repeat a CT angio of the chest to monitor.  Orders:    CT ANGIO CHEST W IV CONTRAST; Future    Essential hypertension  Blood pressure is elevated today however the patient has been somewhat noncompliant with his medication therefore I have encouraged the patient to take his medicines regularly and monitor his pressure at home he will contact with readings.       Cerebrovascular accident (CVA), unspecified mechanism (CMS HCC)  Patient has recovered from previous CVA and has been doing pretty well however I reviewed with the patient his noncompliance with his medication in the blood pressure fluctuating so widely increases his risk for a repeat CVA..       Lumbar radiculopathy  Patient has lumbar radiculopathy has resolved at this point and he is ambulating without difficulty he will be released back to work.            Coding                  Return in about 11 weeks (around 01/20/2024) for In Person Visit.      Brynda Greathouse, PA-C

## 2023-11-04 NOTE — Patient Instructions (Signed)
We will schedule for CT Angiogram of chest to look at Aorta and he flow out of the heart to insure no dilation or aneurysm.  Blood pressure today right arm 160/110  Left arm 130/94, this irregularity concerning for aortic abnormality.

## 2023-11-08 ENCOUNTER — Telehealth (INDEPENDENT_AMBULATORY_CARE_PROVIDER_SITE_OTHER): Payer: Self-pay | Admitting: PHYSICIAN ASSISTANT

## 2023-11-11 ENCOUNTER — Other Ambulatory Visit (INDEPENDENT_AMBULATORY_CARE_PROVIDER_SITE_OTHER): Payer: Self-pay | Admitting: PHYSICIAN ASSISTANT

## 2023-11-22 ENCOUNTER — Other Ambulatory Visit: Payer: Self-pay

## 2023-11-26 NOTE — Assessment & Plan Note (Signed)
Blood pressure is elevated today however the patient has been somewhat noncompliant with his medication therefore I have encouraged the patient to take his medicines regularly and monitor his pressure at home he will contact with readings.

## 2023-11-26 NOTE — Assessment & Plan Note (Signed)
Patient has lumbar radiculopathy has resolved at this point and he is ambulating without difficulty he will be released back to work.

## 2023-11-26 NOTE — Assessment & Plan Note (Signed)
Patient has recovered from previous CVA and has been doing pretty well however I reviewed with the patient his noncompliance with his medication in the blood pressure fluctuating so widely increases his risk for a repeat CVA.Troy Rose

## 2023-12-08 ENCOUNTER — Encounter (INDEPENDENT_AMBULATORY_CARE_PROVIDER_SITE_OTHER): Payer: Self-pay

## 2023-12-08 DIAGNOSIS — S37009A Unspecified injury of unspecified kidney, initial encounter: Secondary | ICD-10-CM | POA: Insufficient documentation

## 2024-01-20 ENCOUNTER — Other Ambulatory Visit: Payer: Self-pay

## 2024-01-20 ENCOUNTER — Other Ambulatory Visit: Payer: 59 | Attending: PHYSICIAN ASSISTANT | Admitting: PHYSICIAN ASSISTANT

## 2024-01-20 ENCOUNTER — Ambulatory Visit (INDEPENDENT_AMBULATORY_CARE_PROVIDER_SITE_OTHER): Payer: 59 | Admitting: PHYSICIAN ASSISTANT

## 2024-01-20 ENCOUNTER — Encounter (INDEPENDENT_AMBULATORY_CARE_PROVIDER_SITE_OTHER): Payer: Self-pay | Admitting: PHYSICIAN ASSISTANT

## 2024-01-20 VITALS — BP 138/85 | HR 68 | Temp 98.0°F | Resp 16 | Ht 72.0 in | Wt 229.2 lb

## 2024-01-20 DIAGNOSIS — I10 Essential (primary) hypertension: Secondary | ICD-10-CM

## 2024-01-20 DIAGNOSIS — E039 Hypothyroidism, unspecified: Secondary | ICD-10-CM

## 2024-01-20 DIAGNOSIS — M109 Gout, unspecified: Secondary | ICD-10-CM | POA: Insufficient documentation

## 2024-01-20 DIAGNOSIS — Z1159 Encounter for screening for other viral diseases: Secondary | ICD-10-CM | POA: Insufficient documentation

## 2024-01-20 DIAGNOSIS — E782 Mixed hyperlipidemia: Secondary | ICD-10-CM

## 2024-01-20 DIAGNOSIS — Z125 Encounter for screening for malignant neoplasm of prostate: Secondary | ICD-10-CM

## 2024-01-20 DIAGNOSIS — R739 Hyperglycemia, unspecified: Secondary | ICD-10-CM

## 2024-01-20 DIAGNOSIS — I2510 Atherosclerotic heart disease of native coronary artery without angina pectoris: Secondary | ICD-10-CM | POA: Insufficient documentation

## 2024-01-20 DIAGNOSIS — G8194 Hemiplegia, unspecified affecting left nondominant side: Secondary | ICD-10-CM

## 2024-01-20 DIAGNOSIS — E790 Hyperuricemia without signs of inflammatory arthritis and tophaceous disease: Secondary | ICD-10-CM

## 2024-01-20 LAB — CBC WITH DIFF
BASOPHIL #: <0.1 10*3/uL (ref <=0.20)
BASOPHIL %: 0.4 %
EOSINOPHIL #: 0.65 10*3/uL — ABNORMAL HIGH (ref <=0.50)
EOSINOPHIL %: 7.2 %
HCT: 52.2 % — ABNORMAL HIGH (ref 38.9–52.0)
HGB: 17.1 g/dL (ref 13.4–17.5)
IMMATURE GRANULOCYTE #: <0.1 10*3/uL (ref <0.10)
IMMATURE GRANULOCYTE %: 0.4 % (ref 0.0–1.0)
LYMPHOCYTE #: 2.6 10*3/uL (ref 1.00–4.80)
LYMPHOCYTE %: 28.6 %
MCH: 29.8 pg (ref 26.0–32.0)
MCHC: 32.8 g/dL (ref 31.0–35.5)
MCV: 91.1 fL (ref 78.0–100.0)
MONOCYTE #: 0.78 10*3/uL (ref 0.20–1.10)
MONOCYTE %: 8.6 %
MPV: 10.3 fL (ref 8.7–12.5)
NEUTROPHIL #: 4.97 10*3/uL (ref 1.50–7.70)
NEUTROPHIL %: 54.8 %
PLATELETS: 259 10*3/uL (ref 150–400)
RBC: 5.73 10*6/uL (ref 4.50–6.10)
RDW-CV: 13.7 % (ref 11.5–15.5)
WBC: 9.1 10*3/uL (ref 3.7–11.0)

## 2024-01-20 LAB — COMPREHENSIVE METABOLIC PNL, FASTING
ALBUMIN: 4.5 g/dL (ref 3.5–5.0)
ALKALINE PHOSPHATASE: 104 U/L (ref 45–115)
ALT (SGPT): 57 U/L — ABNORMAL HIGH (ref <43)
ANION GAP: 10 mmol/L (ref 4–13)
AST (SGOT): 48 U/L — ABNORMAL HIGH (ref 11–34)
BILIRUBIN TOTAL: 0.4 mg/dL (ref 0.3–1.3)
BUN/CREA RATIO: 15 (ref 6–22)
BUN: 18 mg/dL (ref 8–25)
CALCIUM: 10.1 mg/dL (ref 8.6–10.2)
CHLORIDE: 110 mmol/L (ref 96–111)
CO2 TOTAL: 22 mmol/L (ref 22–30)
CREATININE: 1.21 mg/dL (ref 0.75–1.35)
ESTIMATED GFR - MALE: 71 mL/min/BSA (ref >=60)
GLUCOSE: 82 mg/dL (ref 70–99)
POTASSIUM: 4.6 mmol/L (ref 3.5–5.1)
PROTEIN TOTAL: 7.8 g/dL (ref 6.4–8.3)
SODIUM: 142 mmol/L (ref 136–145)

## 2024-01-20 LAB — LIPID PANEL
CHOL/HDL RATIO: 3.3
CHOLESTEROL: 156 mg/dL (ref 100–200)
HDL CHOL: 47 mg/dL — ABNORMAL LOW (ref >=50)
LDL CALC: 93 mg/dL (ref <100)
NON-HDL: 109 mg/dL (ref <=190)
TRIGLYCERIDES: 87 mg/dL (ref <150)
VLDL CALC: 14 mg/dL (ref <30)

## 2024-01-20 LAB — HGA1C (HEMOGLOBIN A1C WITH EST AVG GLUCOSE)
ESTIMATED AVERAGE GLUCOSE: 117 mg/dL
HEMOGLOBIN A1C: 5.7 % (ref 4.0–6.0)

## 2024-01-20 LAB — HEPATITIS C ANTIBODY SCREEN WITH REFLEX TO HCV PCR: HCV ANTIBODY QUALITATIVE: NEGATIVE

## 2024-01-20 LAB — THYROID STIMULATING HORMONE (SENSITIVE TSH): TSH: 1.59 u[IU]/mL (ref 0.350–4.940)

## 2024-01-20 LAB — URIC ACID: URIC ACID: 6.6 mg/dL (ref 3.5–7.5)

## 2024-01-20 LAB — THYROXINE, FREE (FREE T4): THYROXINE (T4), FREE: 1.14 ng/dL (ref 0.70–1.48)
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
## 2024-01-20 NOTE — Assessment & Plan Note (Addendum)
Patient will continue a low-sodium diet continue his current regimen of antihypertensive medications  Orders:    CBC/DIFF; Future    COMPREHENSIVE METABOLIC PNL, FASTING; Future    LIPID PANEL; Future

## 2024-01-20 NOTE — Assessment & Plan Note (Addendum)
This is resolved for the most part with very little residual occasionally when the patient is tired he will experience a tremor on the left side but has full range of motion regained when in restful state.

## 2024-01-20 NOTE — Assessment & Plan Note (Addendum)
He will continue to monitor his diet as well as increasing his aerobic exercise once the weather improves.  Orders:    CBC/DIFF; Future    COMPREHENSIVE METABOLIC PNL, FASTING; Future    LIPID PANEL; Future

## 2024-01-20 NOTE — Assessment & Plan Note (Addendum)
He will continue use in his atorvastatin  Orders:    LIPID PANEL; Future

## 2024-01-20 NOTE — Assessment & Plan Note (Addendum)
He has been tolerating his levothyroxine dosage states that his energy level has been good and he has not noticed excessive dry skin or hair loss or fatigue  Orders:    THYROID STIMULATING HORMONE (SENSITIVE TSH); Future    THYROXINE, FREE (FREE T4); Future

## 2024-01-21 LAB — PSA SCREENING: PSA: 1.02 ng/mL (ref <=4.00)

## 2024-01-24 ENCOUNTER — Ambulatory Visit (INDEPENDENT_AMBULATORY_CARE_PROVIDER_SITE_OTHER): Payer: Self-pay

## 2024-02-22 ENCOUNTER — Other Ambulatory Visit (INDEPENDENT_AMBULATORY_CARE_PROVIDER_SITE_OTHER): Payer: Self-pay | Admitting: PHYSICIAN ASSISTANT

## 2024-05-08 ENCOUNTER — Encounter (INDEPENDENT_AMBULATORY_CARE_PROVIDER_SITE_OTHER): Payer: Self-pay | Admitting: Family Medicine

## 2024-05-22 ENCOUNTER — Ambulatory Visit (INDEPENDENT_AMBULATORY_CARE_PROVIDER_SITE_OTHER): Payer: Self-pay | Admitting: PHYSICIAN ASSISTANT

## 2024-05-22 ENCOUNTER — Other Ambulatory Visit: Payer: Self-pay

## 2024-05-22 ENCOUNTER — Encounter (INDEPENDENT_AMBULATORY_CARE_PROVIDER_SITE_OTHER): Payer: Self-pay | Admitting: PHYSICIAN ASSISTANT

## 2024-05-22 VITALS — BP 138/89 | HR 79 | Temp 98.4°F | Resp 16 | Ht 72.0 in | Wt 238.5 lb

## 2024-05-22 DIAGNOSIS — I2510 Atherosclerotic heart disease of native coronary artery without angina pectoris: Secondary | ICD-10-CM

## 2024-05-22 DIAGNOSIS — I10 Essential (primary) hypertension: Secondary | ICD-10-CM

## 2024-05-22 DIAGNOSIS — M5416 Radiculopathy, lumbar region: Secondary | ICD-10-CM

## 2024-05-22 DIAGNOSIS — E039 Hypothyroidism, unspecified: Secondary | ICD-10-CM

## 2024-05-22 DIAGNOSIS — R29898 Other symptoms and signs involving the musculoskeletal system: Secondary | ICD-10-CM

## 2024-05-22 DIAGNOSIS — I639 Cerebral infarction, unspecified: Secondary | ICD-10-CM

## 2024-05-22 DIAGNOSIS — R739 Hyperglycemia, unspecified: Secondary | ICD-10-CM

## 2024-05-22 DIAGNOSIS — I6529 Occlusion and stenosis of unspecified carotid artery: Secondary | ICD-10-CM

## 2024-05-22 MED ORDER — CYCLOBENZAPRINE 10 MG TABLET
10.0000 mg | ORAL_TABLET | Freq: Three times a day (TID) | ORAL | 1 refills | Status: DC
Start: 2024-05-22 — End: 2024-05-29

## 2024-05-22 NOTE — Assessment & Plan Note (Addendum)
 Orders:    CBC/DIFF; Future

## 2024-05-22 NOTE — Assessment & Plan Note (Addendum)
 Recheck lipids, patient has had significant vascular complications  Orders:    LIPID PANEL; Future

## 2024-05-22 NOTE — Assessment & Plan Note (Addendum)
 Improved and no residual deficits  Orders:    LIPID PANEL; Future

## 2024-05-22 NOTE — Assessment & Plan Note (Addendum)
 BP labile monitor closely  Orders:    COMPREHENSIVE METABOLIC PNL, FASTING; Future

## 2024-05-22 NOTE — Assessment & Plan Note (Addendum)
 Significant left sided paravertebral spasm will use muscle relaxants, stretches and follow up with Dr. Tobie on Wednesday.  Refill Flexeril

## 2024-05-22 NOTE — Progress Notes (Signed)
 [facility], [facility] [address]  [address] [name] [address]      Name: Troy Rose MRN:  [id number]   Date: 05/22/2024 DOB:  Feb 08, 1971 (53 y.o.)   PCP: Zachary Public, PA-C    Chief Complaint:  Back Pain (2 weeks ago, pt states the nerve in his back started hurting severely again. He went to the ED for pain meds a week ago. He also notes weakness in his left hand.)       HPI:  Connar Keating is a 53 y.o. White male  patient is doing well other than back pain that is unrelenting, he has appointment with Dr. Tobie this week.  Patient continues to be off work.  Noticed weakness in left hand but is with grip primarily, no dexterity or gross motor function issues  Medications reconciled and current.     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      Past Surgical History:   Procedure Laterality Date    COLONOSCOPY  2016    ESOPHAGOGASTRODUODENOSCOPY      for gi bleed    HX ANGIOPLASTY      HX CAROTID ENDARTERECTOMY  04/29/2022      Social History[1]    Family Medical History:       Problem Relation (Age of Onset)    Cancer Paternal Grandmother    Heart Attack Maternal Grandmother    Heart Disease Brother, Paternal Grandmother    Hypertension (High Blood Pressure) Brother    Lung Cancer Paternal Grandmother    Stroke Mother           Outpatient Medications Marked as Taking for the 05/22/24 encounter (Office Visit) with Public Zachary, PA-C   Medication Sig    allopurinoL  (ZYLOPRIM ) 100 mg  Oral Tablet TAKE ONE (1) TABLET BY MOUTH TWICE A DAY    aspirin (ECOTRIN) 81 mg Oral Tablet, Delayed Release (E.C.) Take 1 Tablet (81 mg total) by mouth Once a day    atorvastatin (LIPITOR) 80 mg Oral Tablet TAKE ONE (1) TABLET BY MOUTH EVERY DAY    colchicine  0.6 mg Oral Tablet Take 1 Tablet (0.6 mg total) by mouth Once a day    cyclobenzaprine (FLEXERIL) 10 mg Oral Tablet Take 1 Tablet (10 mg total) by mouth Three times a day    cyclobenzaprine (FLEXERIL) 10 mg Oral Tablet Take 1 Tablet (10 mg total) by mouth Three times a day    HYDROcodone -acetaminophen  (NORCO) 5-325 mg Oral Tablet Take 1 Tablet by mouth Every 6 hours as needed    levothyroxine (SYNTHROID) 50 mcg Oral Tablet TAKE ONE (1) TABLET EVERY DAY BY ORAL ROUTE.    lisinopriL  (PRINIVIL ) 5 mg Oral Tablet Take 1 Tablet (5 mg total) by mouth Daily    magnesium oxide (MAG-OX) 400 mg Oral Tablet Take 1 Tablet (400 mg total) by mouth Daily    RED BEET ORAL Take  1 Tablet by mouth Daily      Allergies[2]     Review of Systems:  Positive findings addressed in HPI      PHYSICAL EXAM:   The patient appears to be in no acute distress.  Vitals:   Vitals:    05/22/24 1044   BP: 138/89   Pulse: 79   Resp: 16   Temp: 36.9 C (98.4 F)   TempSrc: Temporal   SpO2: 95%   Weight: 108 kg (238 lb 8 oz)   Height: 1.829 m (6')   BMI: 32.35      Physical Exam   HEENT:   Head Normocephalic. No masses, lesions, tenderness or abnormalities  Eyes: PERRLA, EOMI, conjunctiva wnl.    Ears: Bilateral TMs intact and clear.  Nose: Bilateral nares patent  Throat: Pharynx clear. No lymphadenopathy or thyromegaly.  RESPIRATORY: CTA, no resp distress, equal BS  HEART: Regular rate and rhythm, no murmur, no edema, pedal pulses 2+  GI:  BS present in 4 quadrants.  No tenderness, masses, organomegaly.  Abdomen soft.  EXTREMITIES: no edema, erythema or tenderness  NEURO: AAOx3, CN's intact, DTR's 2/4 throughout, Romberg not tested, left straight leg raise at 15 degrees    Data Review:  Pertinent  laboratory data and imaging studies reviewed.             Assessment/Plan:  Assessment & Plan  Stenosis of carotid artery, unspecified laterality  Recheck lipids, patient has had significant vascular complications  Orders:    LIPID PANEL; Future    Cerebrovascular accident (CVA), unspecified mechanism (CMS HCC)  Improved and no residual deficits  Orders:    LIPID PANEL; Future    Essential hypertension  BP labile monitor closely  Orders:    COMPREHENSIVE METABOLIC PNL, FASTING; Future    Coronary arteriosclerosis    Orders:    CBC/DIFF; Future    Hypothyroidism, unspecified type  Thyroid  labs to monitor  Orders:    COMPREHENSIVE METABOLIC PNL, FASTING; Future    THYROID  STIMULATING HORMONE (SENSITIVE TSH); Future    THYROXINE, FREE (FREE T4); Future    T3 (TRIIODOTHYRONINE), FREE, SERUM; Future    Hyperglycemia    Orders:    HGA1C (HEMOGLOBIN A1C WITH EST AVG GLUCOSE); Future    Lumbar radiculopathy  Significant left sided paravertebral spasm will use muscle relaxants, stretches and follow up with Dr. Tobie on Wednesday.  Refill Flexeril       Decreased grip strength of left hand  Perhaps carpal tunnel will assess further post spine evaluation            Coding       Depression screening is negative. PHQ 2 Total: 0              No follow-ups on file.      Zachary Public, PA-C         [1]   Social History  Tobacco Use    Smoking status: Never     Passive exposure: Never    Smokeless tobacco: Never   Vaping Use    Vaping status: Never Used   Substance Use Topics    Alcohol use: Yes     Comment: occasional    Drug use: Never   [2]   Allergies  Allergen Reactions    Hydrochlorothiazide Mental Status Effect    Motrin  [Ibuprofen ] GI Bleed    Tamiflu [Oseltamivir]  Other Adverse Reaction (Add comment)     Jittery

## 2024-05-22 NOTE — Assessment & Plan Note (Addendum)
 Thyroid  labs to monitor  Orders:    COMPREHENSIVE METABOLIC PNL, FASTING; Future    THYROID  STIMULATING HORMONE (SENSITIVE TSH); Future    THYROXINE, FREE (FREE T4); Future    T3 (TRIIODOTHYRONINE), FREE, SERUM; Future

## 2024-05-25 ENCOUNTER — Other Ambulatory Visit (INDEPENDENT_AMBULATORY_CARE_PROVIDER_SITE_OTHER): Payer: Self-pay | Admitting: PHYSICIAN ASSISTANT

## 2024-05-25 NOTE — Telephone Encounter (Signed)
 Pt needs a refill of his levothyroxine    Please advise

## 2024-05-26 MED ORDER — LEVOTHYROXINE 50 MCG TABLET
50.0000 ug | ORAL_TABLET | Freq: Every day | ORAL | 0 refills | Status: DC
Start: 2024-05-26 — End: 2024-08-18

## 2024-05-29 ENCOUNTER — Other Ambulatory Visit: Payer: Self-pay

## 2024-05-29 ENCOUNTER — Ambulatory Visit (INDEPENDENT_AMBULATORY_CARE_PROVIDER_SITE_OTHER): Admitting: Family Medicine

## 2024-05-29 ENCOUNTER — Encounter (INDEPENDENT_AMBULATORY_CARE_PROVIDER_SITE_OTHER): Payer: Self-pay | Admitting: Family Medicine

## 2024-05-29 VITALS — BP 166/98 | HR 105 | Temp 98.0°F | Resp 17 | Ht 72.0 in | Wt 234.2 lb

## 2024-05-29 DIAGNOSIS — M5416 Radiculopathy, lumbar region: Secondary | ICD-10-CM

## 2024-05-29 MED ORDER — CELECOXIB 200 MG CAPSULE
200.0000 mg | ORAL_CAPSULE | Freq: Two times a day (BID) | ORAL | 1 refills | Status: DC
Start: 2024-05-29 — End: 2024-06-29

## 2024-05-29 MED ORDER — BACLOFEN 20 MG TABLET
20.0000 mg | ORAL_TABLET | Freq: Three times a day (TID) | ORAL | 1 refills | Status: DC
Start: 2024-05-29 — End: 2024-06-28

## 2024-05-29 MED ORDER — PREDNISONE 20 MG TABLET
40.0000 mg | ORAL_TABLET | Freq: Every day | ORAL | 0 refills | Status: DC
Start: 2024-05-29 — End: 2024-06-28

## 2024-05-29 NOTE — Progress Notes (Signed)
 [facility] [address]  [address] [name] [address]      Name: Troy Rose MRN:  [id number]   Date: 05/29/2024 DOB:  June 03, 1971 (53 y.o.)     PCP: Zachary Public, PA-C     Subjective  Troy Rose is a 53 y.o. year old male who presents for Back Pain (Pt is here today to follow up on lower back pain. Pt has been taking cyclobenzaprine  and followed up with Dr. Tobie on Wednesday where he received a trigger point injection in his back which gave no relief. Dr. Tobie is out for 2 weeks on vacation and pt is still waiting for an appointment after being referred to the pain clinic. )  to clinic.    1. 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    Inguinal lymphadenopathy 09/29/2023    Lumbar radiculopathy 09/29/2023    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    Left inguinal hernia 08/17/2023    Pain of right lower leg 08/16/2023    Pain of left lower extremity 08/16/2023    Abnormal appearance of skin 06/20/2022    Temperature intolerance 05/06/2022    Effusion of bursa of left knee 04/08/2022    Gout flare 04/08/2022    Septic arthritis of knee, left 04/06/2022    GI bleed 12/19/2007      Current Outpatient Medications   Medication Sig    allopurinoL  (ZYLOPRIM ) 100 mg Oral Tablet TAKE ONE (1) TABLET BY MOUTH TWICE A DAY    aspirin (ECOTRIN) 81 mg Oral Tablet, Delayed Release (E.C.) Take 1 Tablet (81 mg total) by mouth Once a day    atorvastatin (LIPITOR) 80 mg Oral Tablet TAKE ONE (1)  TABLET BY MOUTH EVERY DAY    colchicine  0.6 mg Oral Tablet Take 1 Tablet (0.6 mg total) by mouth Once a day    cyclobenzaprine  (FLEXERIL ) 10 mg Oral Tablet Take 1 Tablet (10 mg total) by mouth Three times a day    cyclobenzaprine  (FLEXERIL ) 10 mg Oral Tablet Take 1 Tablet (10 mg total) by mouth Three times a day    levothyroxine (SYNTHROID) 50 mcg Oral Tablet Take 1 Tablet (50 mcg total) by mouth Daily    lisinopriL  (PRINIVIL ) 5 mg Oral Tablet Take 1 Tablet (5 mg total) by mouth Daily    magnesium oxide (MAG-OX) 400 mg Oral Tablet Take 1 Tablet (400 mg total) by mouth Daily    RED BEET ORAL Take 1 Tablet by mouth Daily      Review of Systems   Constitutional: Negative.    HENT: Negative.     Respiratory: Negative.     Cardiovascular: Negative.    Gastrointestinal: Negative.    Musculoskeletal:  Positive for back pain.   Skin: Negative.      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          Allergies[1]   Social History     Socioeconomic History    Marital status: Married   Tobacco Use    Smoking status: Never     Passive exposure: Never    Smokeless tobacco: Never   Vaping Use    Vaping status: Never Used   Substance and Sexual Activity    Alcohol use: Yes     Comment: occasional    Drug use: Never    Sexual activity: Not Currently      Family Medical History:       Problem Relation (Age of Onset)    Cancer Paternal Grandmother    Heart Attack Maternal Grandmother    Heart Disease Brother, Paternal  Grandmother    Hypertension (High Blood Pressure) Brother    Lung Cancer Paternal Grandmother    Stroke Mother             Past Surgical History:   Procedure Laterality Date    COLONOSCOPY  2016    ESOPHAGOGASTRODUODENOSCOPY      for gi bleed    HX ANGIOPLASTY      HX CAROTID ENDARTERECTOMY  04/29/2022         Health Maintenance   Topic Date Due    NonMedicare Preventative Exam  Never done    HIV Screening  Never done    Adult Tdap-Td (1 - Tdap) Never done    Hepatitis B Vaccine (1 of 3 - 19+ 3-dose series) Never done    Colonoscopy  Never done    Shingles Vaccine (1 of 2) Never done    Pneumococcal Vaccination, Age 75+ (1 of 1 - PCV) Never done    Covid-19 Vaccine (2 - 2024-25 season) 08/01/2023    Influenza Vaccine (Season Ended) 07/31/2024    Depression Screening  05/22/2025    Hepatitis C screening  Completed      Objective: BP (!) 166/98 (Site: Left Arm, Patient Position: Sitting, Cuff Size: Adult)   Pulse (!) 105   Temp 36.7 C (98 F) (Temporal)   Resp 17   Ht 1.829 m (6')   Wt 106 kg (234 lb 3.2 oz)   SpO2 96%   BMI 31.76 kg/m            Physical Exam  Vitals reviewed. Exam conducted with a chaperone present.   HENT:      Head: Normocephalic and atraumatic.   Neck:      Trachea: No tracheal deviation.   Cardiovascular:      Rate and Rhythm: Normal rate and regular rhythm.   Pulmonary:      Effort: Pulmonary effort is normal.      Breath sounds: Normal breath sounds.   Musculoskeletal:         General: Tenderness present.      Cervical back: Normal range of motion.        Assessment/Plan  1. Lumbar radiculopathy (Primary)  Adjust the muscle relaxer  Add  prednisone    Short course of Celebrex because he is using naprosyn  and has bleed risk     If no response will try Zanaflex  or soma                         No follow-ups on file.   Juliane ONEIDA Lingo, DO         [1]   Allergies  Allergen Reactions    Hydrochlorothiazide Mental Status Effect    Motrin  [Ibuprofen ] GI Bleed    Tamiflu [Oseltamivir]  Other  Adverse Reaction (Add comment)     Jittery

## 2024-06-28 ENCOUNTER — Encounter (HOSPITAL_COMMUNITY): Payer: Self-pay

## 2024-06-28 ENCOUNTER — Emergency Department
Admission: EM | Admit: 2024-06-28 | Discharge: 2024-06-28 | Disposition: A | Discharge status: Home / Self Care | Attending: Emergency Medicine | Admitting: Emergency Medicine

## 2024-06-28 ENCOUNTER — Other Ambulatory Visit: Payer: Self-pay

## 2024-06-28 ENCOUNTER — Other Ambulatory Visit (INDEPENDENT_AMBULATORY_CARE_PROVIDER_SITE_OTHER): Payer: Self-pay | Admitting: Family Medicine

## 2024-06-28 DIAGNOSIS — M5416 Radiculopathy, lumbar region: Secondary | ICD-10-CM | POA: Insufficient documentation

## 2024-06-28 DIAGNOSIS — I10 Essential (primary) hypertension: Secondary | ICD-10-CM | POA: Insufficient documentation

## 2024-06-28 DIAGNOSIS — G8929 Other chronic pain: Secondary | ICD-10-CM | POA: Insufficient documentation

## 2024-06-28 DIAGNOSIS — M545 Low back pain, unspecified: Secondary | ICD-10-CM | POA: Insufficient documentation

## 2024-06-28 MED ORDER — ORPHENADRINE CITRATE 30 MG/ML INJECTION SOLUTION
60.0000 mg | INTRAMUSCULAR | Status: AC
Start: 2024-06-28 — End: 2024-06-28
  Administered 2024-06-28: 60 mg via INTRAVENOUS
  Filled 2024-06-28: qty 2

## 2024-06-28 MED ORDER — LISINOPRIL 5 MG TABLET
5.0000 mg | ORAL_TABLET | ORAL | Status: AC
Start: 2024-06-28 — End: 2024-06-28
  Administered 2024-06-28: 5 mg via ORAL
  Filled 2024-06-28: qty 1

## 2024-06-28 MED ORDER — KETOROLAC 30 MG/ML (1 ML) INJECTION SOLUTION
30.0000 mg | INTRAMUSCULAR | Status: AC
Start: 2024-06-28 — End: 2024-06-28
  Administered 2024-06-28: 30 mg via INTRAVENOUS
  Filled 2024-06-28: qty 1

## 2024-06-28 MED ORDER — FENTANYL (PF) 50 MCG/ML INJECTION WRAPPER
25.0000 ug | INJECTION | INTRAMUSCULAR | Status: AC
Start: 2024-06-28 — End: 2024-06-28
  Administered 2024-06-28: 25 ug via INTRAVENOUS
  Filled 2024-06-28: qty 1

## 2024-06-28 MED ORDER — TIZANIDINE 4 MG TABLET
4.0000 mg | ORAL_TABLET | Freq: Three times a day (TID) | ORAL | 0 refills | Status: DC
Start: 2024-06-28 — End: 2024-07-04

## 2024-06-28 NOTE — ED Nurses Note (Signed)
 To ED 10 via [facility]. Alert. Oriented. Monitor applied. Awaiting exam.

## 2024-06-28 NOTE — ED Nurses Note (Signed)
 Patient desat to 85% RA, instructed patient to take deep breaths. Patient sat 95% on RA

## 2024-06-28 NOTE — ED Provider Notes (Signed)
 Emergency Department  [facility]   06/28/2024     Troy Rose  October 29, 1971  53 y.o.  male  [place] [address]   [phone] (home)  PCP: Zachary Public, PA-C   Date of service:06/28/2024 14:27    Chief Complaint:   Chief Complaint   Patient presents with    Hip Pain     Left hip pain radiating down LLE.  Pain since June.   Patient has referral to pain clinic but has not been there yet.   Denies recent fall or injury.            HPI: This is a 53 y.o. male who presents to the emergency department with acute exacerbation of chronic back pain.  He has chronic left sciatica and has been through a couple rounds of steroids.  He is currently on Celebrex .  Was previously on Flexeril  with no relief so has been taking 10 mg of baclofen  as he can not tolerate 20 due to side effects.  Patient has had several days of worsening pain with no acute injury and this morning had severe pain.  He arrives very hypertensive.  States he did take his lisinopril  5 mg this morning.  Of note he is scheduled for epidural steroid injections through pain management which he has already seen on 08/16.                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     (Not in an outpatient encounter)     Past Surgical History:   Past Surgical History:   Procedure Laterality Date    Colonoscopy  2016    Esophagogastroduodenoscopy      Hx angioplasty      Hx carotid endarterectomy   04/29/2022       Social History: Social History[1]     Family History:  Family History   Problem Relation Age of Onset    Stroke Mother     Hypertension (High Blood Pressure) Brother     Heart Disease Brother     Heart Attack Maternal Grandmother     Cancer Paternal Grandmother     Heart Disease Paternal Grandmother     Lung Cancer Paternal Grandmother      Prior to Admission medications    Medication Sig Start Date End Date Taking? Authorizing Provider   allopurinoL  (ZYLOPRIM ) 100 mg Oral Tablet TAKE ONE (1) TABLET BY MOUTH TWICE A DAY 02/24/24  Yes Public Zachary, PA-C   aspirin (ECOTRIN) 81 mg Oral Tablet, Delayed Release (E.C.) Take 1 Tablet (81 mg total) by mouth Daily   Yes Provider, Historical   atorvastatin (LIPITOR) 80 mg Oral Tablet TAKE ONE (1) TABLET BY MOUTH EVERY DAY 11/11/23  Yes Public Zachary, PA-C   celecoxib  (CELEBREX ) 200 mg Oral Capsule Take 1 Capsule (200 mg total) by mouth Twice daily 05/29/24  Yes Arron Ned T, DO   colchicine  0.6 mg Oral Tablet Take 1 Tablet (0.6 mg total) by mouth Daily   Yes Provider, Historical   levothyroxine  (SYNTHROID) 50 mcg Oral Tablet Take 1 Tablet (50 mcg total) by mouth Daily 05/26/24  Yes Quillin, Julie A, FNP-C   lisinopriL  (PRINIVIL ) 5 mg Oral Tablet Take 1 Tablet (5 mg total) by mouth Daily 05/02/24  Yes Provider, Historical   magnesium oxide (MAG-OX) 400 mg Oral Tablet Take 1 Tablet (400 mg total) by mouth Daily   Yes Provider, Historical   RED BEET ORAL Take 1 Tablet by mouth Daily   Yes Provider, Historical   tiZANidine  (ZANAFLEX ) 4 mg Oral Tablet Take 1 Tablet (4 mg total) by mouth Three times a day for 10 days 06/28/24 07/08/24 Yes Gable Senior, MD   Baclofen  (LIORESAL ) 20 mg Oral Tablet Take 1 Tablet (20 mg total) by mouth Three times a day 05/29/24 06/28/24  Arron Ned T, DO   predniSONE  (DELTASONE ) 20 mg Oral Tablet Take 2 Tablets (40 mg total) by mouth Daily 05/29/24 06/28/24  Arron Ned DASEN, DO      Oncology History    No history exists.       Allergies: Allergies[2]    Above history reviewed with patient.  Allergies, medication list, reviewed.          Physical Exam  Body mass index is 31.87 kg/m.  ED Triage Vitals [06/28/24 1228]   BP (Non-Invasive) (!) 137/97   Heart Rate (!) 113   Respiratory Rate 18   Temperature 36.6 C (97.9 F)   SpO2 97 %   Weight 107 kg (235 lb)   Height 1.829 m (6')     Constitutional: patient is oriented to person, place, and time and well-developed, well-nourished, and in no distress.   HENT:   Head: Normocephalic and atraumatic.   Right Ear: External ear normal.   Left Ear: External ear normal.   Nose: Nose normal.   Mouth/Throat: Oropharynx is clear and moist.   Eyes: Pupils are equal, round, and reactive to light. Conjunctivae and EOM are normal.   Neck: Normal range of motion. Neck supple.   Cardiovascular: Normal rate, regular rhythm, normal heart sounds and intact distal pulses.   Pulmonary/Chest: Effort normal and breath sounds normal.   Abdominal: Soft. Bowel sounds are normal.   Musculoskeletal: Normal range of motion.  Normal distal pulses on the left.  Neurological:Patient is alert and oriented to person, place, and time. GCS score is 15.   Skin: Skin is warm and dry.   Psychiatric: Mood, memory, affect and judgment normal.    The following orders were placed after examining the patient :  Orders Placed This Encounter    INSERT & MAINTAIN PERIPHERAL IV ACCESS    ketorolac  (TORADOL ) 30 mg/mL injection    orphenadrine  (NORFLEX ) 30 mg/mL injection    fentaNYL  (SUBLIMAZE ) 50 mcg/mL injection    lisinopril  (PRINIVIL ) tablet    tiZANidine  (ZANAFLEX ) 4 mg Oral Tablet      No orders to display      No results found for this or any previous visit (from the past 12 hours).      ED Course:       Medications Ordered/Administered in the ED   ketorolac  (TORADOL ) 30 mg/mL injection (30 mg Intravenous Given 06/28/24 1253)   orphenadrine  (NORFLEX ) 30 mg/mL injection (60 mg Intravenous Given 06/28/24 1253)   fentaNYL  (SUBLIMAZE ) 50  mcg/mL injection (25 mcg Intravenous Given 06/28/24  1327)   lisinopril  (PRINIVIL ) tablet (5 mg Oral Given 06/28/24 1327)        Emergency Department Procedure:      Procedures    Medical Decision Making  Problems Addressed:  Acute exacerbation of chronic low back pain: acute illness or injury     Details: Patient received acute pain relief with Toradol , Norflex , fentanyl .  Will discontinue baclofen  and try Zanaflex .  No indication for further steroid treatment.  Hypertension, unspecified type: chronic illness or injury with exacerbation, progression, or side effects of treatment     Details: Given an additional 5 mg of lisinopril   Lumbar radiculopathy: chronic illness or injury     Details: Follow-up with pain management as scheduled.    Amount and/or Complexity of Data Reviewed  External Data Reviewed: notes.     Details: Old notes reviewed    Risk  Prescription drug management.  Parenteral controlled substances.            Findings and diagnosis discussed with patient.  Clinical Impression:   Clinical Impression   Lumbar radiculopathy (Primary)   Hypertension, unspecified type   Acute exacerbation of chronic low back pain                 Disposition: Discharged          No follow-ups on file.   New Prescriptions    TIZANIDINE  (ZANAFLEX ) 4 MG ORAL TABLET    Take 1 Tablet (4 mg total) by mouth Three times a day for 10 days      Armida Bare, PA-C  [address]  [place] [address]  [phone]           BP (!) 164/97   Pulse 79   Temp 36.6 C (97.9 F)   Resp 14   Ht 1.829 m (6')   Wt 107 kg (235 lb)   SpO2 95%   BMI 31.87 kg/m        Garnette Loffler, MD7/30/2025              This note was partially created using voice recognition software and is inherently subject to errors including those of syntax and sound alike  substitutions which may escape proof reading.  In such instances, original meaning may be extrapolated by contextual derivation.       [1]   Social History  Tobacco Use    Smoking  status: Never     Passive exposure: Never    Smokeless tobacco: Never   Vaping Use    Vaping status: Never Used   Substance Use Topics    Alcohol use: Not Currently     Comment: occasional    Drug use: Not Currently   [2]   Allergies  Allergen Reactions    Hydrochlorothiazide Mental Status Effect    Motrin  [Ibuprofen ] GI Bleed    Tamiflu [Oseltamivir]  Other Adverse Reaction (Add comment)     Jittery

## 2024-06-28 NOTE — ED Nurses Note (Signed)
 Patient discharged home with family.  AVS reviewed with patient/care giver.  A written copy of the AVS and discharge instructions was given to the patient/care giver.  Questions sufficiently answered as needed.  Patient/care giver encouraged to follow up with PCP as indicated.  In the event of an emergency, patient/care giver instructed to call 911 or go to the nearest emergency room.  Patient stable and oriented. Patient wheelchair out to personal vehicle.

## 2024-06-29 ENCOUNTER — Other Ambulatory Visit: Payer: Self-pay

## 2024-06-29 ENCOUNTER — Encounter (HOSPITAL_COMMUNITY): Payer: Self-pay

## 2024-06-29 ENCOUNTER — Emergency Department
Admission: EM | Admit: 2024-06-29 | Discharge: 2024-06-29 | Disposition: A | Discharge status: Home / Self Care | Attending: Emergency Medicine | Admitting: Emergency Medicine

## 2024-06-29 ENCOUNTER — Telehealth (INDEPENDENT_AMBULATORY_CARE_PROVIDER_SITE_OTHER): Payer: Self-pay | Admitting: Family Medicine

## 2024-06-29 DIAGNOSIS — Z79899 Other long term (current) drug therapy: Secondary | ICD-10-CM | POA: Insufficient documentation

## 2024-06-29 DIAGNOSIS — G4700 Insomnia, unspecified: Secondary | ICD-10-CM

## 2024-06-29 DIAGNOSIS — T887XXA Unspecified adverse effect of drug or medicament, initial encounter: Secondary | ICD-10-CM

## 2024-06-29 DIAGNOSIS — M5432 Sciatica, left side: Secondary | ICD-10-CM

## 2024-06-29 DIAGNOSIS — R9431 Abnormal electrocardiogram [ECG] [EKG]: Secondary | ICD-10-CM | POA: Insufficient documentation

## 2024-06-29 DIAGNOSIS — I10 Essential (primary) hypertension: Secondary | ICD-10-CM | POA: Insufficient documentation

## 2024-06-29 DIAGNOSIS — F15982 Other stimulant use, unspecified with stimulant-induced sleep disorder: Secondary | ICD-10-CM | POA: Insufficient documentation

## 2024-06-29 DIAGNOSIS — T428X5A Adverse effect of antiparkinsonism drugs and other central muscle-tone depressants, initial encounter: Secondary | ICD-10-CM | POA: Insufficient documentation

## 2024-06-29 DIAGNOSIS — M519 Unspecified thoracic, thoracolumbar and lumbosacral intervertebral disc disorder: Secondary | ICD-10-CM

## 2024-06-29 DIAGNOSIS — M5116 Intervertebral disc disorders with radiculopathy, lumbar region: Secondary | ICD-10-CM | POA: Insufficient documentation

## 2024-06-29 LAB — COMPREHENSIVE METABOLIC PANEL, NON-FASTING
ALBUMIN: 4.3 g/dL (ref 3.5–5.0)
ALKALINE PHOSPHATASE: 83 U/L (ref 45–115)
ALT (SGPT): 35 U/L (ref <43)
ANION GAP: 9 mmol/L (ref 4–13)
AST (SGOT): 26 U/L (ref 11–34)
BILIRUBIN TOTAL: 0.5 mg/dL (ref 0.3–1.3)
BUN/CREA RATIO: 19 (ref 6–22)
BUN: 27 mg/dL — ABNORMAL HIGH (ref 8–25)
CALCIUM: 10.2 mg/dL (ref 8.6–10.2)
CHLORIDE: 105 mmol/L (ref 96–111)
CO2 TOTAL: 24 mmol/L (ref 22–30)
CREATININE: 1.44 mg/dL — ABNORMAL HIGH (ref 0.75–1.35)
ESTIMATED GFR - MALE: 58 mL/min/BSA — ABNORMAL LOW (ref >=60)
GLUCOSE: 124 mg/dL (ref 65–125)
POTASSIUM: 5 mmol/L (ref 3.5–5.1)
PROTEIN TOTAL: 7.3 g/dL (ref 6.4–8.3)
SODIUM: 138 mmol/L (ref 136–145)

## 2024-06-29 LAB — CBC WITH DIFF
BASOPHIL #: <0.1 x10ˆ3/uL (ref <=0.20)
BASOPHIL %: 0.2 %
EOSINOPHIL #: <0.1 x10ˆ3/uL (ref <=0.50)
EOSINOPHIL %: 0.5 %
HCT: 47.6 % (ref 38.9–52.0)
HGB: 16.2 g/dL (ref 13.4–17.5)
IMMATURE GRANULOCYTE #: <0.1 x10ˆ3/uL (ref <0.10)
IMMATURE GRANULOCYTE %: 0.5 % (ref 0.0–1.0)
LYMPHOCYTE #: 1.59 x10ˆ3/uL (ref 1.00–4.80)
LYMPHOCYTE %: 15.5 %
MCH: 29.6 pg (ref 26.0–32.0)
MCHC: 34 g/dL (ref 31.0–35.5)
MCV: 87 fL (ref 78.0–100.0)
MONOCYTE #: 0.46 x10ˆ3/uL (ref 0.20–1.10)
MONOCYTE %: 4.5 %
MPV: 8.9 fL (ref 8.7–12.5)
NEUTROPHIL #: 8.11 x10ˆ3/uL — ABNORMAL HIGH (ref 1.50–7.70)
NEUTROPHIL %: 78.8 %
PLATELETS: 223 x10ˆ3/uL (ref 150–400)
RBC: 5.47 x10ˆ6/uL (ref 4.50–6.10)
RDW-CV: 13.3 % (ref 11.5–15.5)
WBC: 10.3 x10ˆ3/uL (ref 3.7–11.0)

## 2024-06-29 LAB — ECG 12-LEAD
Atrial Rate: 79 {beats}/min
Calculated P Axis: 46 degrees
Calculated R Axis: 22 degrees
Calculated T Axis: 43 degrees
PR Interval: 152 ms
QRS Duration: 90 ms
QT Interval: 444 ms
QTC Calculation: 509 ms
Ventricular rate: 79 {beats}/min

## 2024-06-29 LAB — TROPONIN-I: TROPONIN-I HS: 10 ng/L (ref <=35.0)

## 2024-06-29 MED ORDER — METHYLPREDNISOLONE ACETATE 80 MG/ML SUSPENSION FOR INJECTION
40.0000 mg | INTRAMUSCULAR | Status: AC
Start: 2024-06-29 — End: 2024-06-29
  Administered 2024-06-29: 40 mg via INTRAMUSCULAR
  Filled 2024-06-29: qty 1

## 2024-06-29 MED ORDER — HYDRALAZINE 20 MG/ML INJECTION SOLUTION
20.0000 mg | INTRAMUSCULAR | Status: AC
Start: 2024-06-29 — End: 2024-06-29
  Administered 2024-06-29: 20 mg via INTRAVENOUS
  Filled 2024-06-29: qty 1

## 2024-06-29 MED ORDER — METOPROLOL TARTRATE 5 MG/5 ML INTRAVENOUS SOLUTION
5.0000 mg | INTRAVENOUS | Status: DC
Start: 2024-06-29 — End: 2024-06-29

## 2024-06-29 MED ORDER — METHYLPREDNISOLONE 4 MG TABLETS IN A DOSE PACK
ORAL_TABLET | ORAL | 0 refills | Status: DC
Start: 2024-06-29 — End: 2024-08-04

## 2024-06-29 MED ORDER — LISINOPRIL 5 MG TABLET
10.0000 mg | ORAL_TABLET | Freq: Every day | ORAL | 0 refills | Status: DC
Start: 2024-06-29 — End: 2024-07-29

## 2024-06-29 MED ORDER — KETOROLAC 30 MG/ML (1 ML) INJECTION SOLUTION
30.0000 mg | INTRAMUSCULAR | Status: AC
Start: 2024-06-29 — End: 2024-06-29
  Administered 2024-06-29: 30 mg via INTRAMUSCULAR
  Filled 2024-06-29: qty 1

## 2024-06-29 MED ORDER — CLONIDINE HCL 0.1 MG TABLET
0.2000 mg | ORAL_TABLET | ORAL | Status: AC
Start: 2024-06-29 — End: 2024-06-29
  Administered 2024-06-29: 0.2 mg via ORAL
  Filled 2024-06-29: qty 2

## 2024-06-29 NOTE — Telephone Encounter (Signed)
 Patient wife called. He has been having increased to the LLE due to sciatica. Has an epidural injection scheduled at the pain clinic 07/12/24. In addition to his pain, his BP has been increased. Went to ER today in [place]. BP was 200's/140's in ER, they gave him hydralazine  IV , lisinopril  PO, Clonidine  PO, steroid shot. (Record is the epic system). They increased his Lisinopril  to 10 mg daily, and sent with a steroid pack. But his BP is 164/108 this evening. This nurse scheduled him to see Dr. Arron tomorrow. Spoke with Reuben Public, PA-C and was given guidance that patient could take an additional Lisinopril  10 mg this evening if needed due to increased BP - patients wife was notified and verbalized understanding without further questions or concerns.

## 2024-06-29 NOTE — ED Provider Notes (Signed)
 [facility]  Emergency Department  Provider Note    Name: Troy Rose  Age and Gender: 53 y.o. male  Date of Birth: 1971/08/20  Date of Service: 06/29/2024  [place] [address]  [phone] (home)  MRN: [id number]  PCP: Zachary Public, PA-C    Chief Complaint: Insomnia (States that he is unable to sleep; Starts to sleep and jerks awake; dry mouth)      HPI:  History of Present Illness  Troy Rose is a 53 year old male who presents with concerns of a possible allergic reaction and insomnia. He is accompanied by his wife. He was referred by Doctor Shellia for lumbar radiculopathy.    Adverse effects of tizanidine  (zanaflex )  - Insomnia and muscle jerking after starting Zanaflex , last dose at 8:30 PM the previous night  - Wakes up startled and has difficulty returning to sleep  - Anxiety present as a side effect of Zanaflex     Lumbar radiculopathy and pain management  - Lumbar radiculopathy with pain managed by muscle relaxers, Tylenol , and Celebrex   - Baclofen  discontinued due to adverse effects on mood and speech  - Scheduled for epidural injection in August    Elevated blood pressure during pain episodes  - Blood pressure elevates to the 130s/80s during pain episodes    Past Medical / Surgical / Social History:  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       Past Surgical History:   Past Surgical History:   Procedure Laterality Date     Colonoscopy  2016    Esophagogastroduodenoscopy      Hx angioplasty      Hx carotid endarterectomy  04/29/2022       Social History: Social History[1]   Social History     Substance and Sexual Activity   Drug Use Not Currently       Allergies  Allergies: Allergies[2]    Medications  Medications Prior to Admission       Prescriptions    allopurinoL  (ZYLOPRIM ) 100 mg Oral Tablet    TAKE ONE (1) TABLET BY MOUTH TWICE A DAY    aspirin (ECOTRIN) 81 mg Oral Tablet, Delayed Release (E.C.)    Take 1 Tablet (81 mg total) by mouth Daily    atorvastatin (LIPITOR) 80 mg Oral Tablet    TAKE ONE (1) TABLET BY MOUTH EVERY DAY    celecoxib  (CELEBREX ) 200 mg Oral Capsule    Take 1 Capsule (200 mg total) by mouth Twice daily    colchicine  0.6 mg Oral Tablet    Take 1 Tablet (0.6 mg total) by mouth Daily    levothyroxine  (SYNTHROID) 50 mcg Oral Tablet    Take 1 Tablet (50 mcg total) by mouth Daily    lisinopriL  (PRINIVIL ) 5 mg Oral Tablet    Take 1 Tablet (5 mg total) by mouth Daily    magnesium  oxide (MAG-OX) 400 mg Oral Tablet    Take 1 Tablet (400 mg total) by mouth Daily    RED BEET ORAL    Take 1 Tablet by mouth Daily    tiZANidine  (ZANAFLEX ) 4 mg Oral Tablet    Take 1 Tablet (4 mg total) by mouth Three times a day for 10 days            ROS:  ROS : Per HPI unless otherwise specified      Physical Exam:  ED Triage Vitals [06/29/24 0145]   BP (Non-Invasive) (!) 204/149   Heart Rate 99   Respiratory Rate 18   Temperature 36.4 C (97.6 F)   SpO2 97 %   Weight 108 kg (238 lb)   Height 1.829 m (6')     Body mass index is 32.28 kg/m.    Physical Exam    Physical Exam  GENERAL: Alert, cooperative, well developed, no acute distress  Patient alert interactive no distress with my evaluation  HEENT: Normocephalic, normal oropharynx, moist mucous membranes  CHEST: Clear to auscultation bilaterally, No wheezes, rhonchi, or crackles  CARDIOVASCULAR: Normal heart rate and rhythm, S1 and S2 normal without murmurs  ABDOMEN: Soft, non-tender,  non-distended, without organomegaly, Normal bowel sounds  EXTREMITIES: No cyanosis or edema  NEUROLOGICAL: Cranial nerves grossly intact, Moves all extremities without gross motor or sensory deficit.  Straight leg positive in the left especially at about 40 with significant tenderness.    Procedures  : none    Emergency Department Testing:    Orders Placed This Encounter    CBC/DIFF    COMPREHENSIVE METABOLIC PANEL, NON-FASTING    TROPONIN-I    CBC WITH DIFF    EXTRA TUBES    BLUE TOP TUBE    GOLD TOP TUBE    GRAY TOP TUBE    ECG 12-LEAD    cloNIDine  (CATAPRES ) tablet    ketorolac  (TORADOL ) 30 mg/mL injection    methylPREDNISolone  acetate (DEPO-medrol ) 80 mg/mL injection    Methylprednisolone  (MEDROL  DOSEPACK) 4 mg Oral Tablets, Dose Pack    hydrALAZINE  (APRESOLINE ) injection 20 mg    lisinopriL  (PRINIVIL ) 5 mg Oral Tablet     (See Results At [facility] if applicable)    Results        Clinical Impression:   Clinical Impression   Hypertension, unspecified type (Primary)   Insomnia, unspecified type   Medication side effect   Sciatica of left side   Lumbar disc disease       Plan / ED Course / MDM:     Medical Decision Making  A 53 year old male with chronic lumbar radiculopathy presented with insomnia, muscle jerking, and anxiety after starting Zanaflex , along with concerns about breathing during sleep. He has a history of hypertension and recent medication changes for back pain, including discontinuation of baclofen  and initiation of Zanaflex , which led to intolerable side effects. Examination revealed elevated blood pressure, likely related to pain and stress from his radiculopathy. No acute allergic reaction was identified, and his symptoms were attributed to medication side effects.    Lumbar radiculopathy with medication side effects and hypertension  - Administered steroid injection and Toradol  for pain relief  - Prescribed tapering dose of oral steroids  - Discontinued Zanaflex   - Restart baclofen  if needed  -  Administered clonidine  for acute blood pressure reduction  - Continue current blood pressure medications  - Provided reassurance regarding breathing concerns    Hypertension persistent  -unsure whether this is secondary to  pain or other issues  -hypertensive urgency workup initiated with CT EKG, CBC CMP and a troponin to see if a hypertensive urgency picture is occurring  -clonidine  given the initially to combat his hypertension here followed by hydralazine  20 mg IV after minimal improvement in his diastolic blood pressure especially which continued in the 145 range  He is only on 5 lisinopril .  Going to have him increase his lisinopril  to 10 mg and .  He is very resistant to using clonidine  as it had dropped his blood pressure down to low.  Additionally he had had low blood pressures even with the lisinopril  at higher doses at this point, they feel comfortable increasing to 10 doing blood pressure checks on a regular basis twice a day at least and then follow up with PCP over the course of the next week    Medical Decision Making  Problems Addressed:  Hypertension, unspecified type: acute illness or injury that poses a threat to life or bodily functions     Details:  Clonidine  continued pain medicine  Insomnia, unspecified type: acute illness or injury that poses a threat to life or bodily functions     Details:  Medication side effect  Lumbar disc disease: acute illness or injury that poses a threat to life or bodily functions     Details:  Steroid taper    Amount and/or Complexity of Data Reviewed  Labs: ordered.  ECG/medicine tests: ordered and independent interpretation performed.    Risk  OTC drugs.  Prescription drug management.         ED Course as of 06/29/24 0545   Thu Jun 29, 2024   0421 WBC: 10.3   0421 HGB: 16.2   0421 HCT: 47.6   0421 MCV: 87.0   0421 RDW-CV: 13.3   0421 PLATELET COUNT: 223   0421 SODIUM: 138   0421 POTASSIUM: 5.0   0421 CHLORIDE: 105   0421 BUN(!): 27   0421 CREATININE(!): 1.44   0421  BUN/CREAT RATIO: 19   0421 eGFRcr - MALE(!): 58   0421 TROPONIN-I HS: 10.0       Medications Ordered/Administered in the ED   cloNIDine  (CATAPRES ) tablet (0.2 mg Oral Given 06/29/24 0224)   ketorolac  (TORADOL ) 30 mg/mL injection (30 mg IntraMUSCULAR Given 06/29/24 0225)   methylPREDNISolone  acetate (DEPO-medrol ) 80 mg/mL injection (40 mg IntraMUSCULAR Given 06/29/24 0225)   hydrALAZINE  (APRESOLINE ) injection 20 mg (20 mg Intravenous Given 06/29/24 0408)       Disposition: Discharged  Meds Prescribed:  Current Discharge Medication List        START taking these medications    Details   Methylprednisolone  (MEDROL  DOSEPACK) 4 mg Oral Tablets, Dose Pack Take as instructed.  Qty: 21 Tablet, Refills: 0            BP 137/78   Pulse 85   Temp 36.4 C (97.6 F)   Resp 15   Ht 1.829 m (6')   Wt 108 kg (238 lb)   SpO2 93%   BMI 32.28 kg/m       No follow-up provider specified.      Dorn Craze, MD  06/29/2024  05:45  Department of Emergency Medicine  [place] [facility] [place]  ________________________________       Results for orders placed or performed during the hospital encounter of 06/29/24 (from the past 12 hours)   CBC WITH DIFF   Result Value Ref Range    WBC 10.3 3.7 - 11.0 x10^3/uL  RBC 5.47 4.50 - 6.10 x10^6/uL    HGB 16.2 13.4 - 17.5 g/dL    HCT 52.3 61.0 - 47.9 %    MCV 87.0 78.0 - 100.0 fL    MCH 29.6 26.0 - 32.0 pg    MCHC 34.0 31.0 - 35.5 g/dL    RDW-CV 86.6 88.4 - 84.4 %    PLATELETS 223 150 - 400 x10^3/uL    MPV 8.9 8.7 - 12.5 fL    NEUTROPHIL % 78.8 %    LYMPHOCYTE % 15.5 %    MONOCYTE % 4.5 %    EOSINOPHIL % 0.5 %    BASOPHIL % 0.2 %    NEUTROPHIL # 8.11 (H) 1.50 - 7.70 x10^3/uL    LYMPHOCYTE # 1.59 1.00 - 4.80 x10^3/uL    MONOCYTE # 0.46 0.20 - 1.10 x10^3/uL    EOSINOPHIL # <0.10 <=0.50 x10^3/uL    BASOPHIL # <0.10 <=0.20 x10^3/uL    IMMATURE GRANULOCYTE % 0.5 0.0 - 1.0 %    IMMATURE GRANULOCYTE # <0.10 <0.10 x10^3/uL   COMPREHENSIVE METABOLIC PANEL, NON-FASTING   Result Value Ref Range    SODIUM  138 136 - 145 mmol/L    POTASSIUM 5.0 3.5 - 5.1 mmol/L    CHLORIDE 105 96 - 111 mmol/L    CO2 TOTAL 24 22 - 30 mmol/L    ANION GAP 9 4 - 13 mmol/L    BUN 27 (H) 8 - 25 mg/dL    CREATININE 8.55 (H) 0.75 - 1.35 mg/dL    BUN/CREA RATIO 19 6 - 22    ESTIMATED GFR - MALE 58 (L) >=60 mL/min/BSA    ALBUMIN 4.3 3.5 - 5.0 g/dL    CALCIUM 89.7 8.6 - 89.7 mg/dL    GLUCOSE 875 65 - 874 mg/dL    ALKALINE PHOSPHATASE 83 45 - 115 U/L    ALT (SGPT) 35 <43 U/L    AST (SGOT)  26 11 - 34 U/L    BILIRUBIN TOTAL 0.5 0.3 - 1.3 mg/dL    PROTEIN TOTAL 7.3 6.4 - 8.3 g/dL   TROPONIN-I   Result Value Ref Range    TROPONIN-I HS 10.0 <=35.0 ng/L ng/L       Labs Ordered/Reviewed   COMPREHENSIVE METABOLIC PANEL, NON-FASTING - Abnormal; Notable for the following components:       Result Value    BUN 27 (*)     CREATININE 1.44 (*)     ESTIMATED GFR - MALE 58 (*)     All other components within normal limits   CBC WITH DIFF - Abnormal; Notable for the following components:    NEUTROPHIL # 8.11 (*)     All other components within normal limits   TROPONIN-I - Normal   CBC/DIFF    Narrative:     The following orders were created for panel order CBC/DIFF.  Procedure                               Abnormality         Status                     ---------                               -----------         ------  CBC WITH IPQQ[[id number]]                Abnormal            Final result                 Please view results for these tests on the individual orders.   EXTRA TUBES    Narrative:     The following orders were created for panel order EXTRA TUBES.  Procedure                               Abnormality         Status                     ---------                               -----------         ------                     PARIS LEMME ULAZ[[id number]]                                    In process                 GOLD TOP ULAZ[[id number]]                                    In process                 GRAY TOP ULAZ[[id number]]                                     In process                   Please view results for these tests on the individual orders.   BLUE TOP TUBE   GOLD TOP TUBE   GRAY TOP TUBE       No orders to display       This note was partially created using voice recognition software and an AI scribing assistance tool.  As such, it is inherently subject to errors including those of syntax and sound alike  substitutions which may escape proof reading.  In such instances, original meaning may be extrapolated by contextual derivation.      This note was created with assistance from [employer] via capture of conversational audio.  Consent was obtained from the patient and all parties present prior to recording.               [1]   Social History  Tobacco Use    Smoking status: Never     Passive exposure: Never    Smokeless tobacco: Never   Vaping Use    Vaping status: Never Used   Substance Use Topics    Alcohol use: Not Currently     Comment: occasional    Drug use: Not Currently   [2]   Allergies  Allergen Reactions    Hydrochlorothiazide Mental Status Effect    Motrin  [Ibuprofen ] GI Bleed  Tamiflu [Oseltamivir]  Other Adverse Reaction (Add comment)     Jittery

## 2024-06-29 NOTE — Nurses Notes (Signed)
 Discharge reviewed. Patient voiced understanding. AVS reviewed.  A written copy of the AVS and discharge instructions was given.  Questions sufficiently answered as needed.  Encouraged to follow up with PCP as indicated.  In the event of an emergency, instructed to call 911 or go to the nearest emergency room. Patient to POV via a wheelchair.

## 2024-06-29 NOTE — ED Nurses Note (Signed)
 Provided drink per request of patient.

## 2024-06-29 NOTE — Discharge Instructions (Addendum)
 Recommend discontinuing Zanaflex .    Restart your baclofen  as needed   Continue your hydrocodone    We are going to put you on a tapering dose of steroids   Follow up closely with your primary care provider and or the pain medicine specialty provider  Check your blood pressure twice a day for the next week.  Follow up with the primary care provider to get your blood pressure medications adjusted as a appropriate return if your blood pressures get consistently greater than 180/110 without a while taking your blood pressure intervention  Medrol  Dosepak for your chronic low back pain

## 2024-06-30 ENCOUNTER — Ambulatory Visit (INDEPENDENT_AMBULATORY_CARE_PROVIDER_SITE_OTHER): Payer: Self-pay | Admitting: Family Medicine

## 2024-07-04 ENCOUNTER — Ambulatory Visit (INDEPENDENT_AMBULATORY_CARE_PROVIDER_SITE_OTHER): Payer: Self-pay

## 2024-07-04 ENCOUNTER — Encounter (INDEPENDENT_AMBULATORY_CARE_PROVIDER_SITE_OTHER): Payer: Self-pay

## 2024-07-04 ENCOUNTER — Other Ambulatory Visit: Payer: Self-pay

## 2024-07-04 VITALS — BP 180/132 | HR 122 | Temp 97.5°F

## 2024-07-04 DIAGNOSIS — M5432 Sciatica, left side: Secondary | ICD-10-CM

## 2024-07-04 DIAGNOSIS — I10 Essential (primary) hypertension: Secondary | ICD-10-CM

## 2024-07-04 DIAGNOSIS — M5416 Radiculopathy, lumbar region: Secondary | ICD-10-CM

## 2024-07-04 DIAGNOSIS — I6521 Occlusion and stenosis of right carotid artery: Secondary | ICD-10-CM

## 2024-07-04 MED ORDER — CYCLOBENZAPRINE 10 MG TABLET
10.0000 mg | ORAL_TABLET | Freq: Three times a day (TID) | ORAL | 0 refills | Status: DC
Start: 2024-07-04 — End: 2024-08-04

## 2024-07-04 MED ORDER — METOPROLOL TARTRATE 25 MG TABLET
25.0000 mg | ORAL_TABLET | Freq: Two times a day (BID) | ORAL | 0 refills | Status: DC
Start: 2024-07-04 — End: 2024-08-18

## 2024-07-04 NOTE — Progress Notes (Signed)
 [facility]  [address] [name] [address]      Name: Troy Rose MRN:  [id number]   Date: 07/04/2024 DOB:  1971/04/02 (53 y.o.)   PCP: Zachary Public, PA-C    Chief Complaint:  Hospital Follow Up (Patient here for hospital follow up. Patient went to ER for back pain on Friday morning and was kept overnight in ER. )       Assessment/Plan:  I will refill his Percocet  Add Flexeril   His blood pressure is elevated along with his heart rate  A trial metoprolol  25 mg twice a day and monitor blood pressure at home  Discussed side effects of medication  Tolerated trigger point injections today  He should follow-up in 1 week to recheck his blood pressure    ICD-10-CM    1. Lumbar radiculopathy  M54.16       2. Essential hypertension  I10       3. Sciatica of left side  M54.32       4. Right carotid artery occlusion  I65.21               HPI:  Troy Rose is a 53 y.o. White male  is here with his wife today for ER follow-up.  He has a past medical history of chronic low back pain.  He has been followed by Orthopedics at [facility].  He adds that he has an appointment upcoming for an epidural steroid injection with Dr. Tamsen for next Wednesday 07/13/23.   His only comfortable position is lying on his right side with his knees bent.  He was given pain medicine in the ED.  His wife reports he has approximately 3 Percocet left.  He also has a prescription for Toradol  p.o.  He was not able to walk in.  The wife brought him in in a wheelchair.  He denies any red flag symptoms.  Most of his pain is in his left buttock.  His blood pressure is elevated today along with his heart rate.  He states that his pain level is at 10.   Medications reconciled and current.     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      Past Surgical History:   Procedure Laterality Date    COLONOSCOPY  2016    ESOPHAGOGASTRODUODENOSCOPY      for gi bleed    HX ANGIOPLASTY      HX CAROTID ENDARTERECTOMY  04/29/2022      Social History[1]    Family Medical History:       Problem Relation (Age of Onset)    Cancer Paternal Grandmother    Heart Attack Maternal Grandmother    Heart Disease Brother, Paternal Grandmother    Hypertension (High Blood Pressure) Brother    Lung Cancer Paternal Grandmother    Stroke Mother  Outpatient Medications Marked as Taking for the 07/04/24 encounter (Office Visit) with Christia Domke A, FNP-C   Medication Sig    allopurinoL  (ZYLOPRIM ) 100 mg Oral Tablet TAKE ONE (1) TABLET BY MOUTH TWICE A DAY    aspirin (ECOTRIN) 81 mg Oral Tablet, Delayed Release (E.C.) Take 1 Tablet (81 mg total) by mouth Daily    atorvastatin (LIPITOR) 80 mg Oral Tablet TAKE ONE (1) TABLET BY MOUTH EVERY DAY    colchicine  0.6 mg Oral Tablet Take 1 Tablet (0.6 mg total) by mouth Daily    ketorolac  tromethamine  (TORADOL ) 10 mg Oral Tablet Take 1 Tablet (10 mg total) by mouth Every 6 hours as needed for Pain    levothyroxine  (SYNTHROID) 50 mcg Oral Tablet Take 1 Tablet (50 mcg total) by mouth Daily    lisinopriL  (PRINIVIL ) 5 mg Oral Tablet Take 2 Tablets (10 mg total) by mouth Daily    magnesium oxide (MAG-OX) 400 mg Oral Tablet Take 1 Tablet (400 mg total) by mouth Daily    methocarbamoL  (ROBAXIN ) 500 mg Oral Tablet Take 1 Tablet (500 mg total) by mouth Four times a day    Methylprednisolone  (MEDROL  DOSEPACK) 4 mg Oral Tablets, Dose Pack Take as instructed.    oxyCODONE-acetaminophen   (PERCOCET) 10-325 mg Oral tablet 1 Tablet Every 6 hours as needed    RED BEET ORAL Take 1 Tablet by mouth Daily      Allergies[2]     Review of Systems:  Positive findings addressed in HPI      PHYSICAL EXAM:   The patient appears to be in no acute distress.  Vitals:   Vitals:    07/04/24 1211   BP: (!) 180/132   Pulse: (!) 122   Temp: 36.4 C (97.5 F)   TempSrc: Temporal   SpO2: 98%    20553 & 23057 - INJ SINGLE OR MULTIPLE TRIGGER POINTS, 3 OR MORE MUSCLES W US  (AMB ONLY)    Performed by: Boruch Manuele A, FNP-C  Authorized by: Aleksey Newbern A, FNP-C    Documentation:      Trigger point left lumbar L3-L4 paraspinous and SI joint       Physical Exam  Musculoskeletal:      Lumbar back: Spasms and tenderness present. No swelling or deformity. Decreased range of motion. Positive left straight leg raise test.   Neurological:      Mental Status: He is alert and oriented to person, place, and time.      Motor: Weakness present.      Coordination: Coordination is intact.      Gait: Gait abnormal.        HEENT:   Head Normocephalic. No masses, lesions, tenderness or abnormalities  Eyes: PERRLA, EOMI, conjunctiva wnl.    Ears: Bilateral TMs intact and clear.  Nose: Bilateral nares patent  Throat: Pharynx clear. No lymphadenopathy or thyromegaly.  RESPIRATORY: CTA, no resp distress, equal BS  HEART: Tachycardic, no murmur, no edema, pedal pulses 2+  GI:  BS present in 4 quadrants.  No tenderness, masses, organomegaly.  Abdomen soft.  EXTREMITIES: no edema, erythema or tenderness  NEURO: AAOx3, CN's intact, DTR's 2/4 throughout, Romberg not tested    Data Review:     CT:  1. Intact lumbar spine with no acute fracture or dislocation. 2. Intact thoracic spine with no acute fracture or dislocation. 3. Moderate bilateral neural foraminal stenoses at the L4-5 level. 4. Moderate to severe bilateral neural foraminal stenoses at the L5-S1 level. 5. No chronic compression  fracture deformity. 6. No acute foreign body. 7. Slight  levoconvex curve at the mid lumbar spine with the apex of page 2 of 4 Patient Demographics Name: Troy MARKWOOD Date of Birth: 1971-04-09 Gender: Male Address: [address] [place], [place] [address] Id: [id number] Downloaded by [id number]: 2024/07/04 12:19 PM the curve at L3.   MRI:   CLINICAL HISTORY: back pain; leukocytosis; tachycardia; retention. TECHNIQUE: Multisequence and multiplanar images of the lumbar spine were obtained. 20 mL ProHance were injected as the intravenous contrast agent. Images were obtained prior to and following intravenous contrast administration. FINDINGS: Minimal retrolisthesis of L5 on S1. Levoconvex curvature of the lumbar spine. No acute compression injury or marrow replacing process. Conus medullaris terminates at T12-L1. T11-T12, T12-L1: Normal disc height and signal. No disc protrusion, spinal or foraminal stenosis. L1-L2: Normal disc height and mild disc desiccation. Right-sided disc bulging producing mild deformity of the right anterior margin of thecal sac. No spinal or foraminal stenosis. L2-L3: Normal disc height and signal. No disc protrusion or spinal stenosis. Facet DJD with fluid in the joint on the left. No foraminal stenosis. page 1 of 4 Patient Demographics Name: KENDYL BISSONNETTE Date of Birth: 1971/03/13 Gender: Male Address: [address] [place], [place] [address] Id: [id number] Downloaded by [id number]: 2024/07/04 12:49 PM L3-L4: Mild disc space narrowing with anterior spondylosis and disc desiccation. Small left paracentral disc extrusion with caudal migration of disc material deforms the left anterior margin of the thecal sac compressing and displacing the emerging left L4 nerve root. This produces mild spinal canal and left lateral recess stenosis. Right foraminal disc protrusion. Mild facet DJD. Mild bilateral foraminal stenosis.   On the day of the encounter, a total of 20 minutes was spent on this patient encounter including review of  historical information, examination, documentation and post-visit activities. The time documented excludes procedural time.   Coding     No follow-ups on file.      Darcie Mellone A Jadden Yim, FNP-C         [1]   Social History  Tobacco Use    Smoking status: Never     Passive exposure: Never    Smokeless tobacco: Never   Vaping Use    Vaping status: Never Used   Substance Use Topics    Alcohol use: Not Currently     Comment: occasional    Drug use: Not Currently   [2]   Allergies  Allergen Reactions    Hydrochlorothiazide Mental Status Effect    Motrin  [Ibuprofen ] GI Bleed    Tamiflu [Oseltamivir]  Other Adverse Reaction (Add comment)     Jittery

## 2024-07-10 ENCOUNTER — Telehealth (INDEPENDENT_AMBULATORY_CARE_PROVIDER_SITE_OTHER): Payer: Self-pay

## 2024-07-10 ENCOUNTER — Encounter (INDEPENDENT_AMBULATORY_CARE_PROVIDER_SITE_OTHER): Payer: Self-pay

## 2024-07-10 NOTE — Telephone Encounter (Signed)
 Pt called to report his BP readings taken since 07/04/2024 -   146/105, pulse 108  163/145, pulse 119  185/127, pulse 83  183/118, pulse 74  143/99, pulse 69  123/73, pulse 102  139/95, pulse 91  107/70, pulse 92  128/84, pulse 70    Cancelled today's appointment - didn't feel like coming out in the fair traffic

## 2024-07-21 ENCOUNTER — Encounter (INDEPENDENT_AMBULATORY_CARE_PROVIDER_SITE_OTHER): Payer: Self-pay | Admitting: PHYSICIAN ASSISTANT

## 2024-07-25 ENCOUNTER — Encounter (INDEPENDENT_AMBULATORY_CARE_PROVIDER_SITE_OTHER): Admitting: PHYSICIAN ASSISTANT

## 2024-07-25 NOTE — Telephone Encounter (Signed)
 Troy Rose wife called to say he could not make his appointment today w/  you - he is in a lot of pain - was instructed by pain provider to use some muscle relaxers but she stated they did not help.  They re-sch with you for next Friday 9/5 but in the meantime she wanted to know if you would prescribe Tramadol or something else that would give him some relief

## 2024-07-25 NOTE — Telephone Encounter (Signed)
 Patients wife notified. Voiced understanding without questions or concerns.

## 2024-07-28 ENCOUNTER — Other Ambulatory Visit (INDEPENDENT_AMBULATORY_CARE_PROVIDER_SITE_OTHER): Payer: Self-pay | Admitting: PHYSICIAN ASSISTANT

## 2024-08-01 ENCOUNTER — Encounter (HOSPITAL_COMMUNITY): Payer: Self-pay

## 2024-08-01 ENCOUNTER — Other Ambulatory Visit: Payer: Self-pay

## 2024-08-01 ENCOUNTER — Emergency Department (HOSPITAL_COMMUNITY)

## 2024-08-01 ENCOUNTER — Emergency Department
Admission: EM | Admit: 2024-08-01 | Discharge: 2024-08-01 | Disposition: A | Discharge status: Home / Self Care | Attending: Emergency Medicine | Admitting: Emergency Medicine

## 2024-08-01 DIAGNOSIS — R4182 Altered mental status, unspecified: Secondary | ICD-10-CM | POA: Insufficient documentation

## 2024-08-01 MED ORDER — LISINOPRIL 5 MG TABLET
10.0000 mg | ORAL_TABLET | ORAL | Status: AC
Start: 2024-08-01 — End: 2024-08-01
  Administered 2024-08-01: 10 mg via ORAL
  Filled 2024-08-01: qty 2

## 2024-08-01 MED ORDER — KETOROLAC 60 MG/2 ML INTRAMUSCULAR SOLUTION
60.0000 mg | INTRAMUSCULAR | Status: AC
Start: 2024-08-01 — End: 2024-08-01
  Administered 2024-08-01: 60 mg via INTRAMUSCULAR
  Filled 2024-08-01: qty 2

## 2024-08-01 NOTE — Discharge Instructions (Signed)
 Most likely this is due to medications.  Continue Cymbalta and decrease the baclofen  back down to 10 mg.

## 2024-08-01 NOTE — ED Nurses Note (Signed)
Patient discharged home with family.  AVS reviewed with patient/care giver.  A written copy of the AVS and discharge instructions was given to the patient/care giver.  Questions sufficiently answered as needed.  Patient/care giver encouraged to follow up with PCP as indicated.  In the event of an emergency, patient/care giver instructed to call 911 or go to the nearest emergency room.  Patient wheeled out of ED by this RN.

## 2024-08-01 NOTE — ED Provider Notes (Signed)
 Emergency Department  [facility]   08/01/2024     Troy Rose  Dec 09, 1970  53 y.o.  male  [place] [id number]   [phone] (home)  PCP: Zachary Public, PA-C   Date of service:08/01/2024 09:42    Chief Complaint:   Chief Complaint   Patient presents with    Back Pain     Per wife pt having low back and sciatica pain. Today wife states pt was altered and had dilated eyes . Pt was able to follow commands. Wife wants pt checked out.            HPI: This is a 53 y.o. male who presents to the emergency department with an episode this morning concerning for a TIA to his wife.  Patient does have a history of stroke status post left carotid endarterectomy.  He has totally occluded right internal carotid artery.  He is on aspirin as an antiplatelet agent.  Denies any residual symptoms from his stroke.  Previous stroke did affect the left side.  This morning wife states that patient when she woke him up had somewhat slurred speech in his eye seemed to dilated.  His left hand was "drawn" but he was able to move it and she did a bedside neurologic exam including rapid alternating movements that everything looked normal.  Of note patient has recently been increased back up to 20 mg of baclofen  from 10 and 2 weeks ago started Cymbalta.  States that he has had carotid Doppler studies which looked improved compared to the previous study within the last year.                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     (Not in an outpatient encounter)     Past Surgical History:   Past Surgical History:   Procedure Laterality Date    Colonoscopy  2016    Esophagogastroduodenoscopy      Hx angioplasty      Hx carotid endarterectomy  04/29/2022       Social History: Social History[1]     Family History:  Family History   Problem Relation Age of Onset    Stroke Mother     Hypertension (High Blood Pressure) Brother     Heart Disease Brother     Heart Attack Maternal Grandmother     Cancer Paternal Grandmother     Heart Disease Paternal Grandmother     Lung Cancer Paternal Grandmother      Prior to Admission medications    Medication Sig Start Date End Date Taking? Authorizing Provider   allopurinoL  (ZYLOPRIM ) 100 mg Oral Tablet TAKE ONE (1) TABLET BY MOUTH TWICE A DAY 02/24/24   Public Zachary, PA-C   aspirin (ECOTRIN) 81 mg Oral Tablet, Delayed Release (E.C.) Take 1 Tablet (81 mg total) by mouth Daily  Provider, Historical   atorvastatin (LIPITOR) 80 mg Oral Tablet TAKE ONE (1) TABLET BY MOUTH EVERY DAY 11/11/23   Armida Bare, PA-C   Baclofen  (LIORESAL ) 20 mg Oral Tablet Take 1 Tablet (20 mg total) by mouth Three times a day   Yes Provider, Historical   colchicine  0.6 mg Oral Tablet Take 1 Tablet (0.6 mg total) by mouth Daily    Provider, Historical   cyclobenzaprine  (FLEXERIL ) 10 mg Oral Tablet Take 1 Tablet (10 mg total) by mouth Three times a day 07/04/24   Quillin, Julie A, FNP-C   DULoxetine (CYMBALTA DR) 30 mg Oral Capsule, Delayed Release(E.C.) Take 1 Capsule (30 mg total) by mouth Daily   Yes Provider, Historical   ketorolac  tromethamine  (TORADOL ) 10 mg Oral Tablet Take 1 Tablet (10 mg total) by mouth Every 6 hours as needed for Pain 07/01/24   Provider, Historical   levothyroxine  (SYNTHROID) 50 mcg Oral Tablet Take 1 Tablet (50 mcg total) by mouth Daily 05/26/24   Quillin, Julie A, FNP-C   lisinopriL  (PRINIVIL ) 5 mg Oral Tablet TAKE TWO (2) TABLETS (10 MG TOTAL) BY  MOUTH DAILY 07/29/24   Armida Bare, PA-C   magnesium oxide (MAG-OX) 400 mg Oral Tablet Take 1 Tablet (400 mg total) by mouth Daily    Provider, Historical   Methylprednisolone  (MEDROL  DOSEPACK) 4 mg Oral Tablets, Dose Pack Take as instructed. 06/29/24   Cheryl Dorn NOVAK, MD   metoprolol  tartrate (LOPRESSOR ) 25 mg Oral Tablet Take 1 Tablet (25 mg total) by mouth Twice daily 07/04/24   Quillin, Julie A, FNP-C   oxyCODONE-acetaminophen  (PERCOCET) 10-325 mg Oral tablet 1 Tablet Every 6 hours as needed 07/03/24   Provider, Historical   RED BEET ORAL Take 1 Tablet by mouth Daily    Provider, Historical   celecoxib  (CELEBREX ) 200 mg Oral Capsule TAKE ONE (1) CAPSULE (200 MG TOTAL) BY MOUTH TWICE DAILY 06/29/24 07/04/24  Armida Bare, PA-C   lisinopriL  (PRINIVIL ) 5 mg Oral Tablet Take 2 Tablets (10 mg total) by mouth Daily 06/29/24 07/29/24  Cheryl Dorn NOVAK, MD   methocarbamoL  (ROBAXIN ) 500 mg Oral Tablet Take 1 Tablet (500 mg total) by mouth Four times a day 07/02/24 07/04/24  Provider, Historical   tiZANidine  (ZANAFLEX ) 4 mg Oral Tablet Take 1 Tablet (4 mg total) by mouth Three times a day for 10 days 06/28/24 07/04/24  Gable Senior, MD      Oncology History    No problem history exists.      Allergies: Allergies[2]    Above history reviewed with patient.  Allergies, medication list, reviewed.          Physical Exam  Body mass index is 31.87 kg/m.  ED Triage Vitals [08/01/24 0902]   BP (Non-Invasive) (!) 172/122   Heart Rate 73   Respiratory Rate 18   Temperature 36.7 C (98.1 F)   SpO2 99 %   Weight 107 kg (235 lb)   Height 1.829 m (6')     Constitutional: patient is oriented to person, place, and time and well-developed, well-nourished, and in no distress.   HENT:   Head: Normocephalic and atraumatic.   Right Ear: External ear normal.   Left Ear: External ear normal.   Nose: Nose normal.   Mouth/Throat: Oropharynx is clear and moist.   Eyes: Pupils are equal, round, and reactive to light. Conjunctivae and EOM are normal.   Neck:  Normal range of motion. Neck supple.  Left carotid endarterectomy scar  Cardiovascular: Normal rate, regular  rhythm, normal heart sounds and intact distal pulses.   Pulmonary/Chest: Effort normal and breath sounds normal.   Abdominal: Soft. Bowel sounds are normal.   Musculoskeletal: Normal range of motion.   Neurological:Patient is alert and oriented to person, place, and time.  Cranial nerves 2-12 intact.  Symmetric motor and sensory function.  No weakness.  Normal rapid alternating movements.  GCS score is 15.   Skin: Skin is warm and dry.   Psychiatric: Mood, memory, affect and judgment normal.    The following orders were placed after examining the patient :  Orders Placed This Encounter    CT BRAIN WO IV CONTRAST      CT BRAIN WO IV CONTRAST   Final Result   Chronic right MCA territory infarction with no acute intracranial abnormality seen.            The CT exam was performed using one or more of the following dose reduction techniques: Automated exposure control, adjustment of the mA and/or kV according to the patient's size, or use of iterative reconstruction technique.               Radiologist location ID: [id number]            No results found for this or any previous visit (from the past 12 hours).      ED Course:             Emergency Department Procedure:      Procedures    Medical Decision Making  I feel most likely this is medication side effect.  His examination is normal.  Wife is concerned and wants a CT scan    Problems Addressed:  Altered mental status, unspecified altered mental status type: acute illness or injury     Details: Most likely secondary to medications.  Will continue Cymbalta as it is helping with his back pain but will decrease baclofen  back down to 10 mg.    Amount and/or Complexity of Data Reviewed  External Data Reviewed: notes.     Details: Old notes reviewed  Radiology: ordered. Decision-making details documented in ED Course.            Findings and diagnosis discussed  with patient.  Clinical Impression:   Clinical Impression   Altered mental status, unspecified altered mental status type (Primary)                   Disposition: Discharged          No follow-ups on file.   New Prescriptions    No medications on file      Armida Bare, DEVONNA  [address]  [place] [address]  [phone]           BP (!) 172/122   Pulse 73   Temp 36.7 C (98.1 F)   Resp 18   Ht 1.829 m (6')   Wt 107 kg (235 lb)   SpO2 99%   BMI 31.87 kg/m        Garnette Loffler, MD9/01/2024              This note was partially created using voice recognition software and is inherently subject to errors including those of syntax and sound alike  substitutions which may escape proof reading.  In such instances, original meaning may be extrapolated by contextual derivation.         [1]   Social History  Tobacco Use    Smoking status: Never  Passive exposure: Never    Smokeless tobacco: Never   Vaping Use    Vaping status: Never Used   Substance Use Topics    Alcohol use: Not Currently     Comment: occasional    Drug use: Not Currently   [2]   Allergies  Allergen Reactions    Hydrochlorothiazide Mental Status Effect    Motrin  [Ibuprofen ] GI Bleed    Tamiflu [Oseltamivir]  Other Adverse Reaction (Add comment)     Jittery

## 2024-08-04 ENCOUNTER — Ambulatory Visit (INDEPENDENT_AMBULATORY_CARE_PROVIDER_SITE_OTHER): Admitting: PHYSICIAN ASSISTANT

## 2024-08-04 ENCOUNTER — Encounter (INDEPENDENT_AMBULATORY_CARE_PROVIDER_SITE_OTHER): Payer: Self-pay | Admitting: PHYSICIAN ASSISTANT

## 2024-08-04 ENCOUNTER — Other Ambulatory Visit: Payer: Self-pay

## 2024-08-04 VITALS — BP 144/92 | HR 84 | Temp 97.5°F | Resp 17 | Ht 72.0 in | Wt 222.1 lb

## 2024-08-04 DIAGNOSIS — I2510 Atherosclerotic heart disease of native coronary artery without angina pectoris: Secondary | ICD-10-CM

## 2024-08-04 DIAGNOSIS — M5432 Sciatica, left side: Secondary | ICD-10-CM

## 2024-08-04 DIAGNOSIS — I639 Cerebral infarction, unspecified: Secondary | ICD-10-CM

## 2024-08-04 DIAGNOSIS — M79605 Pain in left leg: Secondary | ICD-10-CM

## 2024-08-04 DIAGNOSIS — M5416 Radiculopathy, lumbar region: Secondary | ICD-10-CM

## 2024-08-04 MED ORDER — AMITRIPTYLINE 25 MG TABLET
25.0000 mg | ORAL_TABLET | Freq: Every evening | ORAL | 1 refills | Status: DC
Start: 2024-08-04 — End: 2024-09-25

## 2024-08-04 MED ORDER — HYDROCODONE 5 MG-ACETAMINOPHEN 325 MG TABLET
1.0000 | ORAL_TABLET | ORAL | 0 refills | Status: AC | PRN
Start: 2024-08-04 — End: 2024-08-07

## 2024-08-04 NOTE — Progress Notes (Signed)
 [facility]  [address] [place] [address]      Name: Troy Rose MRN:  [id number]   Date: 08/04/2024 DOB:  December 19, 1970 (53 y.o.)       This note was created with assistance from [employer] via capture of conversational audio.  Consent was obtained from the patient prior to recording.    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    He experiences persistent back pain and radiculopathy, primarily affecting his lower back and radiating down his leg. The pain is severe, limiting his ability to sit or stand for extended periods, and varies in intensity day by day.    He has undergone several interventions, including a lumbar epidural steroid injection, which provided no relief. A recent nerve conduction study indicated a compressed nerve at L4-L5. An MRI showed mild spinal canal stenosis at L3-L4 and L4-L5, with multiple level foraminal stenosis.    He has been prescribed nerve medication, which initially caused side effects but has been adjusted to a lower dose. Despite this, he occasionally requires hydrocodone  for pain management. He has also tried various topical treatments and over-the-counter medications with limited success.    His condition significantly impacts his daily life, requiring assistance with mobility and affecting his ability to perform routine activities.    No recent fever or signs of infection. He experienced a transient episode of feeling 'funny' after taking nerve medication, which led to an ER visit where a CT scan ruled out a stroke.    Physical Exam  Alert and oriented, skin is warm and dry PERRLA, TM's are clear bilaterally, lungs CTA, HRRR Abdomen soft NT ND BS active, Extremities ROM limited due to low back pain and sciatica    Results  RADIOLOGY  Lumbar spine MRI: Mild spinal canal stenosis at L3-L4, L4-L5, multiple level foraminal stenosis  (07/01/2024)  Head CT: No intracranial hemorrhage detected (08/01/2024)    DIAGNOSTIC  Nerve conduction study: Compressed nerve at L4-L5 (07/28/2024)    Assessment & Plan  Lumbar radiculopathy with left-sided sciatica  Chronic lumbar radiculopathy with left-sided sciatica at L3, L4, and L5 with mild spinal canal stenosis and foraminal stenosis. Previous lumbar epidural steroid injection ineffective. Nerve conduction study shows L4-5 nerve compression. Duloxetine provides partial relief.  - Prescribe amitriptyline  at bedtime.  - Prescribe hydrocodone  for pain.  - Continue duloxetine.  - Consider short-term back brace use.  - Encourage sitting support device use.  - Obtain nerve conduction study for second opinion.  - Discuss surgical options with Dr. Tobie on the 22nd; consider second opinion before surgery.  - Consider alternative pain management like nerve ablation; discussed benefits of up to one year relief.  - Consider surgical options including discectomy, rods, and cages; discussed risks including postoperative complications.      ICD-10-CM    1. Lumbar radiculopathy  M54.16       2. Sciatica of left side  M54.32       3. Coronary arteriosclerosis  I25.10       4. Cerebrovascular accident (CVA), unspecified mechanism (CMS HCC)  I63.9       5. Pain of left lower extremity  M79.605

## 2024-08-14 ENCOUNTER — Telehealth (INDEPENDENT_AMBULATORY_CARE_PROVIDER_SITE_OTHER): Payer: Self-pay | Admitting: PHYSICIAN ASSISTANT

## 2024-08-14 NOTE — Telephone Encounter (Signed)
 Pt has jury duty in [place] coming up. Needs a letter from you stating that he can not make the trip due to being unable to sit in a car that long with out pain. He did not give me a date of when he needed the letter. I asked but he said he would have to get back to me. TT

## 2024-08-17 ENCOUNTER — Encounter (INDEPENDENT_AMBULATORY_CARE_PROVIDER_SITE_OTHER): Payer: Self-pay | Admitting: PHYSICIAN ASSISTANT

## 2024-08-17 ENCOUNTER — Other Ambulatory Visit (INDEPENDENT_AMBULATORY_CARE_PROVIDER_SITE_OTHER): Payer: Self-pay

## 2024-08-18 ENCOUNTER — Other Ambulatory Visit (INDEPENDENT_AMBULATORY_CARE_PROVIDER_SITE_OTHER): Payer: Self-pay

## 2024-08-29 ENCOUNTER — Other Ambulatory Visit (INDEPENDENT_AMBULATORY_CARE_PROVIDER_SITE_OTHER): Payer: Self-pay | Admitting: PHYSICIAN ASSISTANT

## 2024-09-04 ENCOUNTER — Ambulatory Visit (INDEPENDENT_AMBULATORY_CARE_PROVIDER_SITE_OTHER): Payer: Self-pay | Admitting: PHYSICIAN ASSISTANT

## 2024-09-05 HISTORY — PX: HX BACK SURGERY: SHX140

## 2024-09-08 ENCOUNTER — Ambulatory Visit (INDEPENDENT_AMBULATORY_CARE_PROVIDER_SITE_OTHER): Payer: Self-pay | Admitting: PHYSICIAN ASSISTANT

## 2024-09-08 ENCOUNTER — Other Ambulatory Visit: Payer: Self-pay

## 2024-09-08 ENCOUNTER — Encounter (INDEPENDENT_AMBULATORY_CARE_PROVIDER_SITE_OTHER): Payer: Self-pay | Admitting: PHYSICIAN ASSISTANT

## 2024-09-08 VITALS — BP 126/88 | HR 69 | Temp 97.6°F | Resp 16 | Ht 72.0 in | Wt 228.7 lb

## 2024-09-08 DIAGNOSIS — M79605 Pain in left leg: Secondary | ICD-10-CM

## 2024-09-08 DIAGNOSIS — I10 Essential (primary) hypertension: Secondary | ICD-10-CM

## 2024-09-08 DIAGNOSIS — M5416 Radiculopathy, lumbar region: Secondary | ICD-10-CM

## 2024-09-08 DIAGNOSIS — Z9889 Other specified postprocedural states: Secondary | ICD-10-CM

## 2024-09-08 DIAGNOSIS — L299 Pruritus, unspecified: Secondary | ICD-10-CM

## 2024-09-08 DIAGNOSIS — Z1211 Encounter for screening for malignant neoplasm of colon: Secondary | ICD-10-CM

## 2024-09-08 DIAGNOSIS — N183 Chronic kidney disease, stage 3 unspecified: Secondary | ICD-10-CM

## 2024-09-08 NOTE — Progress Notes (Signed)
 [facility]  [address] [name] [address]      Name: Troy Rose MRN:  [id number]   Date: 09/08/2024 DOB:  [birth year] (53 y.o.)       This note was created with assistance from [employer] via capture of conversational audio.  Consent was obtained from the patient prior to recording.    History of Present Illness  Geroge Gilliam is a 53 year old male who presents for post-operative follow-up after recent surgery.    Pain has subsided except for some soreness related to the surgery. No fevers, chills, or loss of bowel or bladder function since the surgery. He is able to walk without a walker and feels stable while walking.    He experiences itching all over his body without any welts or rash, ongoing for about a month.    He was told his kidney function was reduced, with a glomerular filtration rate of 55. He has had a blood transfusion in the past due to significant bleeding, receiving 12 to 14 units of blood. He is currently on medications including allopurinol , atorvastatin, and amitriptyline , which he takes at bedtime. His blood pressure has been stable, typically around 126/80s, and he did not take his old blood pressure medication on the morning of the surgery.    He mentions a family history of cancer, with his father having died from cancer that started in the mouth and throat. He has not had a colonoscopy in a while and there is no known family history of inflammatory bowel disease or colon cancer.    He reports soreness in his legs, particularly in the front, which he attributes to increased movement and walking. He has not taken pain medication for a couple of weeks prior to surgery, except for Tylenol  with codeine  occasionally.    Physical Exam  VITALS: BP- 126/80  MEASUREMENTS: Weight- 360 feet.  NEUROLOGICAL: Sensation intact in toes, able to move without issues.    Results  LABS  GFR: 55  Antibody screen: Presence of antibodies    Assessment & Plan  Status post lumbar  spine surgery with left-sided lumbar radiculopathy and left lower extremity pain  Post-operative status stable. Pain managed, ambulating well, no stability or sensation issues.  - Continue wearing brace as instructed.  - Avoid bending, twisting, lifting, pushing, or pulling.  - Follow up with surgeon on October 22nd.    Stage 3 chronic kidney disease  Stage 3 CKD with GFR 55. Discussed management, monitoring for progression, and impact of medications and hydration.  - Monitor kidney function every six months.    Essential hypertension  Blood pressure well-controlled with current regimen, consistently in 120s/80s.  - Continue current antihypertensive regimen.  - Monitor blood pressure regularly.    Generalized pruritus  Generalized pruritus without rash, possibly due to high phosphorus or medication side effects. Discussed parathyroid hormone and phosphorus role.  - Take Tums when experiencing pruritus.  - Consider Benadryl for symptomatic relief if needed.  - Evaluate phosphorus levels if pruritus persists.

## 2024-09-15 ENCOUNTER — Other Ambulatory Visit (INDEPENDENT_AMBULATORY_CARE_PROVIDER_SITE_OTHER): Payer: Self-pay | Admitting: PHYSICIAN ASSISTANT

## 2024-09-25 ENCOUNTER — Other Ambulatory Visit (INDEPENDENT_AMBULATORY_CARE_PROVIDER_SITE_OTHER): Payer: Self-pay | Admitting: PHYSICIAN ASSISTANT

## 2024-09-28 ENCOUNTER — Other Ambulatory Visit (INDEPENDENT_AMBULATORY_CARE_PROVIDER_SITE_OTHER): Payer: Self-pay | Admitting: PHYSICIAN ASSISTANT

## 2024-09-28 ENCOUNTER — Other Ambulatory Visit (INDEPENDENT_AMBULATORY_CARE_PROVIDER_SITE_OTHER): Payer: Self-pay | Admitting: Family Medicine

## 2024-09-29 NOTE — Telephone Encounter (Signed)
 Duplicate refill

## 2024-10-17 LAB — COLOGUARD® COLON CANCER SCREEN: COLOGUARD RESULT: NEGATIVE

## 2024-10-23 ENCOUNTER — Ambulatory Visit (INDEPENDENT_AMBULATORY_CARE_PROVIDER_SITE_OTHER): Payer: Self-pay

## 2024-10-24 ENCOUNTER — Encounter (HOSPITAL_COMMUNITY): Payer: Self-pay

## 2024-11-03 ENCOUNTER — Other Ambulatory Visit (INDEPENDENT_AMBULATORY_CARE_PROVIDER_SITE_OTHER): Payer: Self-pay | Admitting: PHYSICIAN ASSISTANT

## 2024-11-07 ENCOUNTER — Encounter (INDEPENDENT_AMBULATORY_CARE_PROVIDER_SITE_OTHER): Payer: Self-pay | Admitting: PHYSICIAN ASSISTANT

## 2024-11-07 ENCOUNTER — Other Ambulatory Visit: Payer: Self-pay

## 2024-11-07 ENCOUNTER — Ambulatory Visit (INDEPENDENT_AMBULATORY_CARE_PROVIDER_SITE_OTHER): Payer: Self-pay | Admitting: PHYSICIAN ASSISTANT

## 2024-11-07 ENCOUNTER — Other Ambulatory Visit: Attending: PHYSICIAN ASSISTANT | Admitting: PHYSICIAN ASSISTANT

## 2024-11-07 VITALS — BP 116/82 | HR 84 | Temp 97.8°F | Resp 17 | Ht 72.0 in | Wt 241.6 lb

## 2024-11-07 DIAGNOSIS — I2510 Atherosclerotic heart disease of native coronary artery without angina pectoris: Secondary | ICD-10-CM | POA: Insufficient documentation

## 2024-11-07 DIAGNOSIS — I10 Essential (primary) hypertension: Secondary | ICD-10-CM

## 2024-11-07 DIAGNOSIS — R55 Syncope and collapse: Secondary | ICD-10-CM

## 2024-11-07 DIAGNOSIS — R739 Hyperglycemia, unspecified: Secondary | ICD-10-CM

## 2024-11-07 DIAGNOSIS — E039 Hypothyroidism, unspecified: Secondary | ICD-10-CM | POA: Insufficient documentation

## 2024-11-07 DIAGNOSIS — N183 Chronic kidney disease, stage 3 unspecified: Secondary | ICD-10-CM

## 2024-11-07 DIAGNOSIS — I129 Hypertensive chronic kidney disease with stage 1 through stage 4 chronic kidney disease, or unspecified chronic kidney disease: Secondary | ICD-10-CM

## 2024-11-07 LAB — COMPREHENSIVE METABOLIC PNL, FASTING
ALBUMIN: 4 g/dL (ref 3.5–5.0)
ALKALINE PHOSPHATASE: 124 U/L — ABNORMAL HIGH (ref 45–115)
ALT (SGPT): 198 U/L — ABNORMAL HIGH (ref <43)
ANION GAP: 9 mmol/L (ref 4–13)
AST (SGOT): 141 U/L — ABNORMAL HIGH (ref 11–34)
BILIRUBIN TOTAL: 0.4 mg/dL (ref 0.3–1.3)
BUN/CREA RATIO: 9 (ref 6–22)
BUN: 11 mg/dL (ref 8–25)
CALCIUM: 9.7 mg/dL (ref 8.6–10.2)
CHLORIDE: 107 mmol/L (ref 96–111)
CO2 TOTAL: 22 mmol/L (ref 22–30)
CREATININE: 1.24 mg/dL (ref 0.75–1.35)
GLUCOSE: 88 mg/dL (ref 70–99)
POTASSIUM: 4.1 mmol/L (ref 3.5–5.1)
PROTEIN TOTAL: 7 g/dL (ref 6.4–8.3)
SODIUM: 138 mmol/L (ref 136–145)
eGFRcr - MALE: 69 mL/min/1.73mˆ2 (ref >=60)

## 2024-11-07 LAB — CBC WITH DIFF
BASOPHIL #: <0.1 x10ˆ3/uL (ref <=0.20)
BASOPHIL %: 0.5 %
EOSINOPHIL #: 0.7 x10ˆ3/uL — ABNORMAL HIGH (ref <=0.50)
EOSINOPHIL %: 7.4 %
HCT: 45.6 % (ref 38.9–52.0)
HGB: 14.6 g/dL (ref 13.4–17.5)
IMMATURE GRANULOCYTE #: <0.1 x10ˆ3/uL (ref <0.10)
IMMATURE GRANULOCYTE %: 0.6 % (ref 0.0–1.0)
LYMPHOCYTE #: 2.93 x10ˆ3/uL (ref 1.00–4.80)
LYMPHOCYTE %: 31.2 %
MCH: 30.7 pg (ref 26.0–32.0)
MCHC: 32 g/dL (ref 31.0–35.5)
MCV: 95.8 fL (ref 78.0–100.0)
MONOCYTE #: 0.77 x10ˆ3/uL (ref 0.20–1.10)
MONOCYTE %: 8.2 %
MPV: 9.5 fL (ref 8.7–12.5)
NEUTROPHIL #: 4.89 x10ˆ3/uL (ref 1.50–7.70)
NEUTROPHIL %: 52.1 %
PLATELETS: 264 x10ˆ3/uL (ref 150–400)
RBC: 4.76 x10ˆ6/uL (ref 4.50–6.10)
RDW-CV: 13.4 % (ref 11.5–15.5)
WBC: 9.4 x10ˆ3/uL (ref 3.7–11.0)

## 2024-11-07 LAB — LIPID PANEL
CHOL/HDL RATIO: 3.6
CHOLESTEROL: 147 mg/dL (ref 100–200)
HDL CHOL: 41 mg/dL — ABNORMAL LOW (ref >=50)
LDL CALC: 86 mg/dL (ref <100)
NON-HDL: 106 mg/dL (ref <=190)
TRIGLYCERIDES: 110 mg/dL (ref <150)
VLDL CALC: 17 mg/dL (ref <30)

## 2024-11-07 LAB — HGA1C (HEMOGLOBIN A1C WITH EST AVG GLUCOSE)
ESTIMATED AVERAGE GLUCOSE: 105 mg/dL
HEMOGLOBIN A1C: 5.3 % (ref 4.0–6.0)

## 2024-11-07 LAB — THYROID STIMULATING HORMONE (SENSITIVE TSH): TSH: 1.238 u[IU]/mL (ref 0.350–4.940)

## 2024-11-07 MED ORDER — LEVOTHYROXINE 50 MCG TABLET: 50.0000 ug | ORAL_TABLET | Freq: Every day | ORAL | 3 refills | Status: AC

## 2024-11-07 MED ORDER — LISINOPRIL 5 MG TABLET: 5.0000 mg | ORAL_TABLET | Freq: Every day | ORAL | 4 refills | Status: AC

## 2024-11-07 NOTE — Progress Notes (Unsigned)
 [facility]  [address] [place] [address]      Name: Troy Rose MRN:  [id number]   Date: 11/07/2024 DOB:  1971/11/28 (53 y.o.)       This note was created with assistance from [employer] via capture of conversational audio.  Consent was obtained from the patient prior to recording.    History of Present Illness  Troy Rose is a 53 year old male with hypertension and coronary artery disease who presents with a syncopal episode during physical therapy.    Two weeks ago, during a physical therapy session, he experienced a syncopal episode. While reaching for a band, he blacked out and fell to the floor. Prior to the episode, his heart rate was elevated while on the bike, and he felt winded. His blood pressure was recorded at 106/70 immediately after the episode. He did not report feeling lightheaded or dizzy before the syncopal episode, but described the experience as 'kind of went dark.'    He has a history of hypertension and is currently taking lisinopril , which was recently reduced from 10 mg to 5 mg due to low blood pressure readings. His blood pressure was 116/82 on the morning of the visit. He also takes medication for gout and thyroid  issues, and aspirin.    He reports occasional balance issues, which he attributes to previous strokes. He has not had therapy following these strokes. No pain since his back surgery and he is able to perform physical activities without discomfort.    His family history is significant for his brother having a heart attack in his forties and a heart murmur.    Physical Exam  VITALS: P- 124, BP- 106/70  HEAD EYES EARS NOSE THROAT: Head normocephalic. No masses, lesions, tenderness or abnormalities. Eyes PERRLA, EOMI, conjunctiva within normal limits. Ears bilateral TMs intact and clear. Nose bilateral nares patent. Throat pharynx clear. No lymphadenopathy or thyromegaly.  RESPIRATORY: Clear to auscultation, no respiratory distress, equal breath  sounds.  HEART: Regular rate and rhythm, no murmur, no edema, pedal pulses 2 plus.  GASTROINTESTINAL: Bowel sounds present in 4 quadrants. No tenderness, masses, organomegaly. Abdomen soft.  EXTREMITIES: No edema, erythema or tenderness.  NEUROLOGICAL: Alert and oriented times 3, cranial nerves intact, deep tendon reflexes 2 out of 4 throughout, Romberg not tested.  MUSCULOSKELETAL: Normal range of motion in legs.    Results  PATHOLOGY    Cologuard: Negative    Assessment & Plan  Coronary artery disease with syncope  Recent syncope likely due to positional change and strain. Elevated heart rate with rapid fluctuations during exercise. Stress test not recommended. Referral to cardiology for further evaluation needed.  - Referred to Dr. Kelly Piety, cardiologist in [place].  - Advised to avoid strenuous positions and activities requiring breath holding or exertion.  - Instructed to stand up slowly and wait before walking to prevent syncope.  - Ordered EKG to assess baseline cardiac function.    Essential hypertension  Blood pressure well-controlled on 5 mg lisinopril  after adjustment from 10 mg due to hypotension. Current reading 116/82 mmHg.  - Continue lisinopril  5 mg daily.    Hypothyroidism  Thyroid  medication refilled and taken as prescribed.  - Continue current thyroid  medication regimen.    Stage 3 chronic kidney disease  No changes in management during this visit.

## 2024-11-08 ENCOUNTER — Telehealth (INDEPENDENT_AMBULATORY_CARE_PROVIDER_SITE_OTHER): Payer: Self-pay | Admitting: INTERVENTIONAL CARDIOLOGY

## 2024-11-08 ENCOUNTER — Encounter (INDEPENDENT_AMBULATORY_CARE_PROVIDER_SITE_OTHER): Payer: Self-pay | Admitting: INTERVENTIONAL CARDIOLOGY

## 2024-11-08 NOTE — Telephone Encounter (Signed)
 Patient was returning your call about to set up a new patient visit. A good call back number for the patient is [phone]

## 2024-11-10 ENCOUNTER — Ambulatory Visit (INDEPENDENT_AMBULATORY_CARE_PROVIDER_SITE_OTHER): Payer: Self-pay

## 2024-11-10 ENCOUNTER — Telehealth (INDEPENDENT_AMBULATORY_CARE_PROVIDER_SITE_OTHER): Payer: Self-pay | Admitting: PHYSICIAN ASSISTANT

## 2024-11-10 NOTE — Telephone Encounter (Signed)
 Spoke with patient's wife on the phone today. She said the patient's short term disability was denied. The response was that the patient's condition should not interfere with his ability to do his job. She is asking if there is a letter that the provider can write so that she can appeal the denial. Patient also gave her e-mail if we can e-mail paperwork     Email [email or web address]     Or patient will come to office to pick up.

## 2024-11-20 ENCOUNTER — Inpatient Hospital Stay (INDEPENDENT_AMBULATORY_CARE_PROVIDER_SITE_OTHER)
Admission: RE | Admit: 2024-11-20 | Discharge: 2024-11-20 | Disposition: A | Discharge status: Home / Self Care | Source: Ambulatory Visit

## 2024-11-20 ENCOUNTER — Encounter (INDEPENDENT_AMBULATORY_CARE_PROVIDER_SITE_OTHER): Payer: Self-pay | Admitting: NURSE PRACTITIONER

## 2024-11-20 ENCOUNTER — Other Ambulatory Visit: Payer: Self-pay

## 2024-11-20 ENCOUNTER — Ambulatory Visit: Payer: Self-pay | Attending: NURSE PRACTITIONER | Admitting: NURSE PRACTITIONER

## 2024-11-20 VITALS — BP 124/84 | HR 76 | Temp 96.9°F | Resp 16 | Ht 72.0 in | Wt 240.9 lb

## 2024-11-20 DIAGNOSIS — M5416 Radiculopathy, lumbar region: Secondary | ICD-10-CM

## 2024-11-20 DIAGNOSIS — G8194 Hemiplegia, unspecified affecting left nondominant side: Secondary | ICD-10-CM | POA: Insufficient documentation

## 2024-11-20 DIAGNOSIS — F419 Anxiety disorder, unspecified: Secondary | ICD-10-CM | POA: Insufficient documentation

## 2024-11-20 DIAGNOSIS — I6529 Occlusion and stenosis of unspecified carotid artery: Secondary | ICD-10-CM

## 2024-11-20 DIAGNOSIS — S37009A Unspecified injury of unspecified kidney, initial encounter: Secondary | ICD-10-CM | POA: Insufficient documentation

## 2024-11-20 DIAGNOSIS — I10 Essential (primary) hypertension: Secondary | ICD-10-CM

## 2024-11-20 DIAGNOSIS — E785 Hyperlipidemia, unspecified: Secondary | ICD-10-CM

## 2024-11-20 DIAGNOSIS — I6521 Occlusion and stenosis of right carotid artery: Secondary | ICD-10-CM | POA: Insufficient documentation

## 2024-11-20 DIAGNOSIS — I639 Cerebral infarction, unspecified: Secondary | ICD-10-CM

## 2024-11-20 DIAGNOSIS — I2510 Atherosclerotic heart disease of native coronary artery without angina pectoris: Secondary | ICD-10-CM | POA: Insufficient documentation

## 2024-11-20 DIAGNOSIS — Z8673 Personal history of transient ischemic attack (TIA), and cerebral infarction without residual deficits: Secondary | ICD-10-CM | POA: Insufficient documentation

## 2024-11-20 NOTE — Progress Notes (Signed)
 Cardiology [facility], Medical Office Building [place]  [address]  [place] [address]  [phone]    Cardiology  Clinic Note    Name: Troy Rose   DOB: 1971/06/18  [53 y.o. male]   MRN: [id number]       Visit Date: 11/20/2024   Referring: No referring provider defined for this encounter.   PCP: Zachary Public, PA-C         Chief Complaint: New Patient      History of Present Illness   Troy Rose is a 53 y.o. White male who presents today for new patient visit.  Has a past medical history of hyperlipidemia, hypothyroidism, right carotid artery occlusion, GI bleed and anxiety.  Patient was originally referred here secondary to syncopal episode that occurred with physical therapy.  He recently underwent back surgery for severe sciatica reports he feels better.  He has no known history of coronary disease does.  He follows with vascular [facility]  He denies any chest pain, palpitations, chest pressure, dyspnea, bilateral lower extremity edema.  He has only had 1 episode of syncope.    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    Inguinal lymphadenopathy 09/29/2023    Lumbar radiculopathy 09/29/2023    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    Left inguinal hernia 08/17/2023    Pain of right lower leg 08/16/2023    Pain of left lower extremity 08/16/2023    Abnormal appearance of skin 06/20/2022    Temperature intolerance 05/06/2022    Effusion of bursa of left knee 04/08/2022    Gout flare 04/08/2022    Septic arthritis of  knee, left 04/06/2022    GI bleed 12/19/2007       Allergies  Allergies[1]    Medications  Current Medications[2]    History  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         Past Surgical History:   Procedure Laterality Date    COLONOSCOPY  2016    ESOPHAGOGASTRODUODENOSCOPY      for gi bleed    HX ANGIOPLASTY      HX BACK SURGERY  09/05/2024    HX CAROTID ENDARTERECTOMY  04/29/2022     Social History     Socioeconomic History    Marital status: Married   Tobacco Use    Smoking status: Never     Passive exposure: Never  Smokeless tobacco: Never   Vaping Use    Vaping status: Never Used   Substance and Sexual Activity    Alcohol use: Not Currently     Comment: occasional    Drug use: Not Currently    Sexual activity: Not Currently     Family Medical History:       Problem Relation (Age of Onset)    Cancer Paternal Grandmother    Heart Attack Maternal Grandmother    Heart Disease Brother, Paternal Grandmother    Hypertension (High Blood Pressure) Brother    Lung Cancer Paternal Grandmother    Stroke Mother              Review of Systems:  General: Denies fever or chills. No fatigue.   CNS: Denies syncope or dizziness. No headache.  Eyes: Denies recent visual changes. No pain.  Ears, Nose, and Throat: Denies ear pain, nasal congestion, or sore throat.  Endocrine: Denies excessive hunger or thirst. No heat/cold intolerance.  Respiratory: Denies shortness of breath. No cough.  Cardiovascular: Per HPI  Denies claudication or varicose veins.  GI: Denies nausea/vomiting. No black/bloody bowel movements.  GU: Denies hematuria or dysuria. No incontinence.  Musculoskeletal: Denies joint swelling or decreased ROM.  Skin: No rashes, hives, or eczema.  Psychiatric: Denies anxiety, depression, or insomnia.  Hematologic: No bleedings disorders or swollen nodes.    All other systems reviewed and either negative or not pertinent.     Physical Examination:  There were no vitals taken for this visit.      General: Awake. Well nourished. No acute distress. A&O x 3.  HEENT: Normocephalic, atraumatic. PERRL. Normal hearing.  Neck: No JVD or bruit. Trachea midline.  Lungs: Clear to auscultation. No rhonchi or wheezing noted. Unlabored.  Heart: Regular rate and rhythm. No murmurs, rubs, or gallops are appreciated.  Abdomen: BS normal x 4 quadrants. Soft, non-tender, non-distended.  Extremities: No clubbing, cyanosis, or edema.  Psychiatric: Cooperative, appropriate mood and affect.  Musculoskeletal: Strength grips are equal. No red, swollen joints. MAE.  Skin: Warm, dry, and intact. No rashes or hives are noted.         Orders Placed This Encounter    ECG 12 Lead w/ Interp ([facility]  - In Clinic, Same Day)       There are no discontinued medications.    Assessment and Plan:  Assessment/Plan   1. Coronary arteriosclerosis    2. Essential hypertension    3. Hyperlipidemia, unspecified hyperlipidemia type    4. Stenosis of carotid artery, unspecified laterality    5. Cerebrovascular accident (CVA), unspecified mechanism (CMS HCC)    6. Lumbar radiculopathy    7. Right carotid artery occlusion    8. Left hemiparesis (CMS HCC)    9. Injury of kidney    10. Anxiety        PLAN:  Patient presents today for new patient referral secondary to syncopal episode that happened at physical therapy post back procedure.  He denies any anginal equivalent complaints at this time.  Discuss plan of care with the patient wife who was present at bedside.  He  is agreeable to undergo exercise stress, echocardiogram and 14 day outpatient monitor.  I will call patient when these results are available.  PCP's checking fasting lipid panel.  Patient is to report to the emergency department if he has any further events of syncope or passing out.  Patient normotensive today 12 lead EKG demonstrates normal sinus rhythm.  He will follow  up in 6 months unless needed to be seen sooner.      The patient was given the opportunity to ask questions and those questions were answered to the patient's satisfaction. The patient was encouraged to call with any additional questions or concerns. Discussed with the effects and side effects of medications. Medication safety was discussed. The patient was informed to contact the office within 7 business days if a message/lab results/referral/imaging results have not been conveyed to the patient.     Scot Ricks, APRN, CNP  [facility]  Cardiology  [facility]    A portion of this documentation may have been generated using [facility] voice recognition software and may contain syntax/voice recognition errors.            [1]   Allergies  Allergen Reactions    Hydrochlorothiazide Mental Status Effect    Motrin  [Ibuprofen ] GI Bleed    Tamiflu [Oseltamivir]  Other Adverse Reaction (Add comment)     Jittery   [2]   Current Outpatient Medications:     allopurinoL  (ZYLOPRIM ) 100 mg Oral Tablet, TAKE ONE (1) TABLET BY MOUTH TWICE A DAY, Disp: 180 Tablet, Rfl: 2    aspirin (ECOTRIN) 81 mg Oral Tablet, Delayed Release (E.C.), Take 1 Tablet (81 mg total) by mouth Daily, Disp: , Rfl:     atorvastatin (LIPITOR) 80 mg Oral Tablet, TAKE ONE (1) TABLET BY MOUTH EVERY DAY, Disp: 90 Tablet, Rfl: 3    DULoxetine (CYMBALTA DR) 30 mg Oral Capsule, Delayed Release(E.C.), ONE (1) CAPSULE ORALLY ONCE A DAY, Disp: 30 Capsule, Rfl: 2    levothyroxine  (SYNTHROID) 50 mcg Oral Tablet, Take 1 Tablet (50 mcg total) by mouth Daily, Disp: 90 Tablet, Rfl: 3     lisinopriL  (PRINIVIL ) 5 mg Oral Tablet, Take 1 Tablet (5 mg total) by mouth Daily, Disp: 90 Tablet, Rfl: 4    oxyCODONE-acetaminophen  (PERCOCET) 10-325 mg Oral tablet, 1 Tablet Every 6 hours as needed, Disp: , Rfl:

## 2024-11-22 LAB — ECG 12 LEAD W/ INTERP (AMB USE ONLY) (MUSE, IN CLINC) (93005/93010)
Atrial Rate: 76 {beats}/min
Calculated P Axis: 41 degrees
Calculated R Axis: 17 degrees
Calculated T Axis: 62 degrees
PR Interval: 160 ms
QRS Duration: 96 ms
QT Interval: 432 ms
QTC Calculation: 486 ms
Ventricular rate: 76 {beats}/min

## 2024-11-27 ENCOUNTER — Other Ambulatory Visit (INDEPENDENT_AMBULATORY_CARE_PROVIDER_SITE_OTHER): Payer: Self-pay | Admitting: PHYSICIAN ASSISTANT

## 2024-11-28 ENCOUNTER — Encounter (INDEPENDENT_AMBULATORY_CARE_PROVIDER_SITE_OTHER): Payer: Self-pay | Admitting: INTERVENTIONAL CARDIOLOGY

## 2024-11-28 NOTE — Nursing Note (Signed)
 Patient is scheduled for an echo and stress test on January 02, 2025.  I spoke with patient and instructions were mailed. Suzen Rattler

## 2024-12-11 ENCOUNTER — Other Ambulatory Visit: Payer: Self-pay

## 2024-12-11 ENCOUNTER — Ambulatory Visit (INDEPENDENT_AMBULATORY_CARE_PROVIDER_SITE_OTHER)

## 2024-12-11 ENCOUNTER — Other Ambulatory Visit: Attending: PHYSICIAN ASSISTANT | Admitting: PHYSICIAN ASSISTANT

## 2024-12-11 DIAGNOSIS — I10 Essential (primary) hypertension: Secondary | ICD-10-CM | POA: Insufficient documentation

## 2024-12-11 LAB — COMPREHENSIVE METABOLIC PNL, FASTING
ALBUMIN: 4.1 g/dL (ref 3.5–5.0)
ALKALINE PHOSPHATASE: 116 U/L — ABNORMAL HIGH (ref 45–115)
ALT (SGPT): 170 U/L — ABNORMAL HIGH (ref <43)
ANION GAP: 11 mmol/L (ref 4–13)
AST (SGOT): 121 U/L — ABNORMAL HIGH (ref 11–34)
BILIRUBIN TOTAL: 0.4 mg/dL (ref 0.3–1.3)
BUN/CREA RATIO: 12 (ref 6–22)
BUN: 17 mg/dL (ref 8–25)
CALCIUM: 10.2 mg/dL (ref 8.6–10.2)
CHLORIDE: 105 mmol/L (ref 96–111)
CO2 TOTAL: 25 mmol/L (ref 22–30)
CREATININE: 1.4 mg/dL — ABNORMAL HIGH (ref 0.75–1.35)
GLUCOSE: 90 mg/dL (ref 70–99)
POTASSIUM: 4.7 mmol/L (ref 3.5–5.1)
PROTEIN TOTAL: 7.4 g/dL (ref 6.4–8.3)
SODIUM: 141 mmol/L (ref 136–145)
eGFRcr - MALE: 59 mL/min/1.73mˆ2 — ABNORMAL LOW (ref >=60)

## 2024-12-11 LAB — CBC WITH DIFF
BASOPHIL #: <0.1 x10ˆ3/uL (ref <=0.20)
BASOPHIL %: 0.3 %
EOSINOPHIL #: 0.54 x10ˆ3/uL — ABNORMAL HIGH (ref <=0.50)
EOSINOPHIL %: 6.1 %
HCT: 52 % (ref 38.9–52.0)
HGB: 17.1 g/dL (ref 13.4–17.5)
IMMATURE GRANULOCYTE #: <0.1 x10ˆ3/uL (ref <0.10)
IMMATURE GRANULOCYTE %: 0.7 % (ref 0.0–1.0)
LYMPHOCYTE #: 2.11 x10ˆ3/uL (ref 1.00–4.80)
LYMPHOCYTE %: 23.7 %
MCH: 30.5 pg (ref 26.0–32.0)
MCHC: 32.9 g/dL (ref 31.0–35.5)
MCV: 92.9 fL (ref 78.0–100.0)
MONOCYTE #: 0.8 x10ˆ3/uL (ref 0.20–1.10)
MONOCYTE %: 9 %
MPV: 9.9 fL (ref 8.7–12.5)
NEUTROPHIL #: 5.35 x10ˆ3/uL (ref 1.50–7.70)
NEUTROPHIL %: 60.2 %
PLATELETS: 278 x10ˆ3/uL (ref 150–400)
RBC: 5.6 x10ˆ6/uL (ref 4.50–6.10)
RDW-CV: 13.4 % (ref 11.5–15.5)
WBC: 8.9 x10ˆ3/uL (ref 3.7–11.0)

## 2024-12-11 NOTE — Nursing Note (Signed)
 Patient here for ordered labs. Venipuncture explained to patient who voiced understanding without complaints/concerns. Using aseptic technique a 23G 3/4in butterfly was inserted into the right arm with immediate blood return. Successful venipuncture. Butterfly removed intact, pressure applied, and secured with Band-Aid. Patient tolerated task without complaints. Educated patient that lab results will be called to him. Patient voiced no questions or concerns voiced.

## 2024-12-14 ENCOUNTER — Ambulatory Visit (INDEPENDENT_AMBULATORY_CARE_PROVIDER_SITE_OTHER): Payer: Self-pay

## 2024-12-14 ENCOUNTER — Other Ambulatory Visit (INDEPENDENT_AMBULATORY_CARE_PROVIDER_SITE_OTHER): Payer: Self-pay | Admitting: PHYSICIAN ASSISTANT

## 2024-12-14 DIAGNOSIS — R7989 Other specified abnormal findings of blood chemistry: Secondary | ICD-10-CM

## 2024-12-19 ENCOUNTER — Telehealth (INDEPENDENT_AMBULATORY_CARE_PROVIDER_SITE_OTHER): Payer: Self-pay | Admitting: PHYSICIAN ASSISTANT

## 2024-12-19 DIAGNOSIS — R008 Other abnormalities of heart beat: Secondary | ICD-10-CM

## 2024-12-19 DIAGNOSIS — I4949 Other premature depolarization: Secondary | ICD-10-CM

## 2024-12-19 DIAGNOSIS — I493 Ventricular premature depolarization: Secondary | ICD-10-CM

## 2024-12-19 DIAGNOSIS — I471 Supraventricular tachycardia, unspecified: Secondary | ICD-10-CM

## 2024-12-19 LAB — 14 DAY (HOME ENROLLMENT) EXTENDED HOLTER MONITOR
Enrollment Period End: 20260109140324
Enrollment Period Start: 20251226140324
Heart rate (average): 80 {beats}/min
Isolated SVE count: 60803 episodes
Isolated VE Counts: 21811 episodes
Longest bigeminy - duration: 9.9 s
Longest supraventricular tachycardia episode - duration: 14.4 s
Longest supraventricular tachycardia episode - heart rate (: 115 {beats}/min
Longest supraventricular tachycardia episode - number of be: 27 beats
Longest trigeminy - duration: 17.8 s
SVE Couplets Counts: 223 episodes
SVE Triplets Counts: 48 episodes
Supraventricular tachycardia - heart rate (average): 126 {beats}/min
Supraventricular tachycardia - number of episodes: 31
Supraventricular tachycardia with fastest heart rate - dura: 2.7 s
Supraventricular tachycardia with fastest heart rate - hear: 191 {beats}/min
Supraventricular tachycardia with fastest heart rate - numb: 8 beats
VE Couplets Counts: 169 episodes
VE Triplets Counts: 3 episodes
Ventricular tachycardia - heart rate (average): -1 {beats}/min

## 2024-12-22 ENCOUNTER — Other Ambulatory Visit: Payer: Self-pay

## 2024-12-22 ENCOUNTER — Other Ambulatory Visit: Attending: PHYSICIAN ASSISTANT | Admitting: PHYSICIAN ASSISTANT

## 2024-12-22 ENCOUNTER — Ambulatory Visit (INDEPENDENT_AMBULATORY_CARE_PROVIDER_SITE_OTHER)

## 2024-12-22 DIAGNOSIS — R7989 Other specified abnormal findings of blood chemistry: Secondary | ICD-10-CM | POA: Insufficient documentation

## 2024-12-22 NOTE — Nursing Note (Signed)
 Patient here for labs. Orders placed by GB. Labs drawn x1 attempt on right ac with good return. Patient tolerated well. Patient notified that the office will call with results after GB has reviewed his results. Ashley Husband, LPN

## 2024-12-28 ENCOUNTER — Telehealth (HOSPITAL_COMMUNITY): Payer: Self-pay

## 2024-12-29 LAB — LIVER FIBROSIS, FIBROTEST PNL
ALPHA-2-MACROGLOBULIN: 165 mg/dL (ref 106–279)
ALT: 82 U/L — ABNORMAL HIGH (ref 9–46)
APOLIPOPROTEIN A1: 159 mg/dL (ref 94–176)
FIBROSIS SCORE: 0.1
GGT: 35 U/L (ref 3–85)
HAPTOGLOBIN: 127 mg/dL (ref 43–212)
NECROINFLAMMAT ACT SCORE: 0.41
REFERENCE ID: 5912199
TOTAL BILIRUBIN: 0.2 mg/dL (ref 0.2–1.2)

## 2025-01-01 ENCOUNTER — Telehealth (HOSPITAL_COMMUNITY): Payer: Self-pay | Admitting: NURSE PRACTITIONER

## 2025-01-02 ENCOUNTER — Ambulatory Visit (HOSPITAL_COMMUNITY)
Admission: RE | Admit: 2025-01-02 | Discharge: 2025-01-02 | Disposition: A | Source: Ambulatory Visit | Attending: NURSE PRACTITIONER

## 2025-01-02 ENCOUNTER — Ambulatory Visit (HOSPITAL_COMMUNITY)
Admission: RE | Admit: 2025-01-02 | Discharge: 2025-01-02 | Disposition: A | Payer: Self-pay | Source: Ambulatory Visit | Attending: NURSE PRACTITIONER

## 2025-01-02 ENCOUNTER — Ambulatory Visit
Admission: RE | Admit: 2025-01-02 | Discharge: 2025-01-02 | Disposition: A | Source: Ambulatory Visit | Attending: NURSE PRACTITIONER

## 2025-01-02 ENCOUNTER — Other Ambulatory Visit: Payer: Self-pay

## 2025-01-02 ENCOUNTER — Other Ambulatory Visit (INDEPENDENT_AMBULATORY_CARE_PROVIDER_SITE_OTHER): Payer: Self-pay | Admitting: NURSE PRACTITIONER

## 2025-01-02 DIAGNOSIS — G8194 Hemiplegia, unspecified affecting left nondominant side: Secondary | ICD-10-CM

## 2025-01-02 DIAGNOSIS — M5416 Radiculopathy, lumbar region: Secondary | ICD-10-CM

## 2025-01-02 DIAGNOSIS — I639 Cerebral infarction, unspecified: Secondary | ICD-10-CM

## 2025-01-02 DIAGNOSIS — I6529 Occlusion and stenosis of unspecified carotid artery: Secondary | ICD-10-CM

## 2025-01-02 DIAGNOSIS — I6521 Occlusion and stenosis of right carotid artery: Secondary | ICD-10-CM

## 2025-01-02 DIAGNOSIS — I10 Essential (primary) hypertension: Secondary | ICD-10-CM

## 2025-01-02 DIAGNOSIS — I2510 Atherosclerotic heart disease of native coronary artery without angina pectoris: Secondary | ICD-10-CM

## 2025-01-02 DIAGNOSIS — F419 Anxiety disorder, unspecified: Secondary | ICD-10-CM

## 2025-01-02 DIAGNOSIS — I77810 Thoracic aortic ectasia: Secondary | ICD-10-CM

## 2025-01-02 DIAGNOSIS — E785 Hyperlipidemia, unspecified: Secondary | ICD-10-CM

## 2025-01-02 DIAGNOSIS — S37009A Unspecified injury of unspecified kidney, initial encounter: Secondary | ICD-10-CM

## 2025-01-02 LAB — STRESS TEST - ADULT
Angina Index: 0
Baseline HR: 103 {beats}/min
CVIS DUKE TREADMILL SCORE: 5 mmHg
Estimated workload: 7 METS
Exercise duration (min): 4 min
Exercise duration (sec): 59 s
Peak Diastolic BP for Stress Tests: 92 mmHg
Peak Systolic BP Stress Test: 180 mmHg
Post peak HR: 150 {beats}/min
Predicted Max HR: 165 {beats}/min
RECOVERY HR 2 MINS: 115 {beats}/min
RPP: 27000
Recovery HR: 133 {beats}/min
ST DEPRESSION - BASELINE: 0 mm
ST DEPRESSION - RECOVERY: 0
Target HR: 140 {beats}/min

## 2025-01-02 LAB — TRANSTHORACIC ECHOCARDIOGRAM - ADULT
EF VISUAL ESTIMATE: 55
EF: 60

## 2025-01-02 MED ORDER — HYDRALAZINE 20 MG/ML INJECTION SOLUTION
10.0000 mg | Freq: Once | INTRAMUSCULAR | Status: AC
  Administered 2025-01-02: 10 mg via INTRAVENOUS
  Filled 2025-01-02: qty 1

## 2025-01-02 MED ORDER — METOPROLOL SUCCINATE ER 25 MG TABLET,EXTENDED RELEASE 24 HR: 12.5000 mg | ORAL_TABLET | Freq: Every day | ORAL | 3 refills | Status: AC

## 2025-01-04 ENCOUNTER — Ambulatory Visit (INDEPENDENT_AMBULATORY_CARE_PROVIDER_SITE_OTHER): Payer: Self-pay

## 2025-01-04 ENCOUNTER — Telehealth (HOSPITAL_COMMUNITY): Payer: Self-pay | Admitting: NURSE PRACTITIONER

## 2025-01-04 NOTE — Telephone Encounter (Signed)
 Called and spoke with the patient's wife Troy Rose about echocardiogram results.  I informed her that     Procedure:  Transthoracic complete echo, 2D, spectral and tissue Doppler, color flow Doppler, M-mode. Myocardial strain imaging.     Quality:  The study images were of technically adequate quality.     Indications: Coronary arteriosclerosis     Conclusions:  The left ventricular ejection fraction by visual assessment is estimated to be 55-60%.  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     Findings  Left Ventricle:   Normal left ventricular size. Normal wall thickness. Concentric remodeling. The left ventricular ejection fraction by visual assessment is estimated to be 55-60%. Left ventricular  systolic function is normal. No segmental/regional wall motion abnormalities identified. Grade I Diastolic dysfunction present with normal left atrial pressure. The global longitudinal strain was  calculated at -19.4%. GLS value is within normal limits.  Right Ventricle:   Normal right ventricular size. Normal right ventricular systolic function. RV systolic pressure could not be determined due to incomplete tricuspid regurgitation envelope.  Left Atrium:   The left atrium is normal in size.  Right Atrium:   The right atrium is of normal size.  Mitral Valve:   Mild mitral annular calcification. No evidence of mitral stenosis. Trace mitral regurgitation present.  Tricuspid Valve:   There is no evidence of tricuspid stenosis. No significant tricuspid regurgitation present.  Aortic Valve:   Trileaflet aortic valve. No Aortic valve stenosis. There is no evidence of aortic regurgitation.  Pulmonic Valve:   There is no evidence of pulmonic valve stenosis. No significant pulmonic valve regurgitation present.  IVC/Hepatic Veins:   The inferior vena cava was not visualized.  Aorta:   The aortic root is of normal size. 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  Pericardium/Pleural space:   No pericardial effusion.     Electronically  signed by: MD Jayson Rives on 01/02/2025 04:41 PM      She verbalized understanding reported she had informed patient of these results.  Offered chest CT with IV contrast versus repeat echocardiogram in 6 months.  She reported that she would tell the patient give him these options and call the office or discuss at next office visit.

## 2025-05-01 ENCOUNTER — Ambulatory Visit (INDEPENDENT_AMBULATORY_CARE_PROVIDER_SITE_OTHER): Payer: Self-pay

## 2025-05-23 ENCOUNTER — Ambulatory Visit (INDEPENDENT_AMBULATORY_CARE_PROVIDER_SITE_OTHER)
